# Patient Record
Sex: Female | Born: 1995 | Race: Black or African American | Hispanic: No | Marital: Single | Smoking: Former smoker
Health system: Southern US, Community
[De-identification: ages and names within clinical notes are randomized; demographics above are authoritative.]

## PROBLEM LIST (undated history)

## (undated) ENCOUNTER — Inpatient Hospital Stay (HOSPITAL_COMMUNITY): Payer: Self-pay

## (undated) DIAGNOSIS — B999 Unspecified infectious disease: Secondary | ICD-10-CM

## (undated) DIAGNOSIS — R011 Cardiac murmur, unspecified: Secondary | ICD-10-CM

## (undated) DIAGNOSIS — Z8781 Personal history of (healed) traumatic fracture: Secondary | ICD-10-CM

## (undated) DIAGNOSIS — N83209 Unspecified ovarian cyst, unspecified side: Secondary | ICD-10-CM

## (undated) DIAGNOSIS — N200 Calculus of kidney: Secondary | ICD-10-CM

## (undated) DIAGNOSIS — E282 Polycystic ovarian syndrome: Secondary | ICD-10-CM

## (undated) HISTORY — PX: CHOLECYSTECTOMY: SHX55

## (undated) HISTORY — PX: OVARIAN CYST REMOVAL: SHX89

---

## 2017-08-30 ENCOUNTER — Encounter (HOSPITAL_BASED_OUTPATIENT_CLINIC_OR_DEPARTMENT_OTHER): Payer: Self-pay

## 2017-08-30 ENCOUNTER — Emergency Department (HOSPITAL_BASED_OUTPATIENT_CLINIC_OR_DEPARTMENT_OTHER)
Admission: EM | Admit: 2017-08-30 | Discharge: 2017-08-30 | Disposition: A | Discharge status: Home / Self Care | Payer: Self-pay | Attending: Emergency Medicine | Admitting: Emergency Medicine

## 2017-08-30 ENCOUNTER — Emergency Department (HOSPITAL_BASED_OUTPATIENT_CLINIC_OR_DEPARTMENT_OTHER): Payer: Self-pay

## 2017-08-30 ENCOUNTER — Other Ambulatory Visit: Payer: Self-pay

## 2017-08-30 DIAGNOSIS — N946 Dysmenorrhea, unspecified: Secondary | ICD-10-CM | POA: Insufficient documentation

## 2017-08-30 DIAGNOSIS — R102 Pelvic and perineal pain: Secondary | ICD-10-CM | POA: Insufficient documentation

## 2017-08-30 DIAGNOSIS — Z3202 Encounter for pregnancy test, result negative: Secondary | ICD-10-CM | POA: Insufficient documentation

## 2017-08-30 HISTORY — DX: Polycystic ovarian syndrome: E28.2

## 2017-08-30 LAB — URINALYSIS, ROUTINE W REFLEX MICROSCOPIC
BILIRUBIN URINE: NEGATIVE
Bilirubin Urine: NEGATIVE
Glucose, UA: NEGATIVE mg/dL
Glucose, UA: NEGATIVE mg/dL
KETONES UR: NEGATIVE mg/dL
Ketones, ur: 15 mg/dL — AB
LEUKOCYTES UA: NEGATIVE
NITRITE: NEGATIVE
Nitrite: NEGATIVE
PH: 5.5 (ref 5.0–8.0)
PH: 6 (ref 5.0–8.0)
Protein, ur: 100 mg/dL — AB
Protein, ur: NEGATIVE mg/dL
Specific Gravity, Urine: >1.03 — ABNORMAL HIGH (ref 1.005–1.030)

## 2017-08-30 LAB — URINALYSIS, MICROSCOPIC (REFLEX): RBC / HPF: >50 RBC/hpf (ref 0–5)

## 2017-08-30 LAB — CBC
HCT: 40.8 % (ref 36.0–46.0)
Hemoglobin: 14.3 g/dL (ref 12.0–15.0)
MCH: 30 pg (ref 26.0–34.0)
MCHC: 35 g/dL (ref 30.0–36.0)
MCV: 85.7 fL (ref 78.0–100.0)
PLATELETS: 386 10*3/uL (ref 150–400)
RBC: 4.76 MIL/uL (ref 3.87–5.11)
RDW: 13.7 % (ref 11.5–15.5)
WBC: 7.2 10*3/uL (ref 4.0–10.5)

## 2017-08-30 LAB — WET PREP, GENITAL
Sperm: NONE SEEN
Trich, Wet Prep: NONE SEEN
Yeast Wet Prep HPF POC: NONE SEEN

## 2017-08-30 LAB — PREGNANCY, URINE: Preg Test, Ur: NEGATIVE

## 2017-08-30 MED ORDER — SODIUM CHLORIDE 0.9 % IV BOLUS
1000.0000 mL | Freq: Once | INTRAVENOUS | Status: AC
  Administered 2017-08-30: 1000 mL via INTRAVENOUS

## 2017-08-30 MED ORDER — MORPHINE SULFATE (PF) 4 MG/ML IV SOLN
INTRAVENOUS | Status: AC
  Filled 2017-08-30: qty 1

## 2017-08-30 MED ORDER — MORPHINE SULFATE (PF) 4 MG/ML IV SOLN
4.0000 mg | Freq: Once | INTRAVENOUS | Status: AC
  Administered 2017-08-30: 4 mg via INTRAVENOUS
  Filled 2017-08-30: qty 1

## 2017-08-30 MED ORDER — NAPROXEN 500 MG PO TABS: 500.0000 mg | ORAL_TABLET | Freq: Two times a day (BID) | ORAL | 0 refills | Status: DC

## 2017-08-30 MED ORDER — TRAMADOL HCL 50 MG PO TABS: 50.0000 mg | ORAL_TABLET | Freq: Three times a day (TID) | ORAL | 0 refills | Status: DC | PRN

## 2017-08-30 MED ORDER — MORPHINE SULFATE (PF) 4 MG/ML IV SOLN
4.0000 mg | Freq: Once | INTRAVENOUS | Status: AC
  Administered 2017-08-30: 4 mg via INTRAVENOUS

## 2017-08-30 MED ORDER — KETOROLAC TROMETHAMINE 30 MG/ML IJ SOLN
30.0000 mg | Freq: Once | INTRAMUSCULAR | Status: AC
  Administered 2017-08-30: 30 mg via INTRAVENOUS
  Filled 2017-08-30: qty 1

## 2017-08-30 NOTE — Discharge Instructions (Signed)
Take naproxen as prescribed for management of your pain.  You may take tramadol as needed for severe pain.  Do not drive or drink alcohol after taking these medications as it may make you drowsy and impair your judgment.  You may try topical heat pads on your abdomen for discomfort as well.  Follow-up with an OB/GYN for reevaluation of your symptoms.  Should you experience increased bleeding, requiring more than 1 pad per hour for 6-8 consecutive hours, if your heart rate is over 100, or if you experience persistent lightheadedness with loss of consciousness, promptly return to the ED for evaluation.

## 2017-08-30 NOTE — ED Notes (Signed)
Heating pads given to the pt.

## 2017-08-30 NOTE — ED Provider Notes (Signed)
[facility] EMERGENCY DEPARTMENT Provider Note   CSN: [id number] Arrival date & time: 08/30/17  1834     History   Chief Complaint Chief Complaint  Patient presents with  . Vaginal Bleeding    HPI Madison Stone is a 22 y.o. female.  22 year old female presents to the emergency department for lower abdominal pain and cramping.  This has been associated with vaginal bleeding over the past 5 days.  She reports the passage of clots.  Menstrual cycle is usually limited to 2 days and light bleeding.  The patient expresses concern that she may be having a miscarriage.  She has been taking NSAIDs for pain without relief.  Patient also reports some sharper pain in her suprapubic abdomen with voiding.  This is associated with a sharp, burning dysuria at her urethra.  She denies any frequency or urgency.  No fevers, bowel changes, vomiting.  She reports a history of PCOS.  Relocated to [place] 1 month ago from [place].  The history is provided by the patient. No language interpreter was used.  Vaginal Bleeding  Primary symptoms include vaginal bleeding.    Past Medical History:  Diagnosis Date  . PCOS (polycystic ovarian syndrome)     There are no active problems to display for this patient.   History reviewed. No pertinent surgical history.   OB History   None      Home Medications    Prior to Admission medications   Medication Sig Start Date End Date Taking? Authorizing Provider  naproxen (NAPROSYN) 500 MG tablet Take 1 tablet (500 mg total) by mouth 2 (two) times daily. 08/30/17   Antony Madura, PA-C  traMADol (ULTRAM) 50 MG tablet Take 1 tablet (50 mg total) by mouth every 8 (eight) hours as needed. 08/30/17   Antony Madura, PA-C    Family History No family history on file.  Social History Social History   Tobacco Use  . Smoking status: Never Smoker  . Smokeless tobacco: Never Used  Substance Use Topics  . Alcohol use: Never    Frequency: Never  .  Drug use: Yes    Types: Marijuana     Allergies   Amoxicillin and Latex   Review of Systems Review of Systems  Genitourinary: Positive for vaginal bleeding.   Ten systems reviewed and are negative for acute change, except as noted in the HPI.    Physical Exam Updated Vital Signs BP 123/87   Pulse 68   Temp (!) 97.5 F (36.4 C) (Oral)   Resp 20   Ht 5\' 1"  (1.549 m)   Wt 78.5 kg (173 lb 1.6 oz)   LMP 03/30/2017   SpO2 99%   BMI 32.71 kg/m   Physical Exam  Constitutional: She is oriented to person, place, and time. She appears well-developed and well-nourished. No distress.  Nontoxic appearing and in no distress  HENT:  Head: Normocephalic and atraumatic.  Eyes: Conjunctivae and EOM are normal. No scleral icterus.  Neck: Normal range of motion.  Cardiovascular: Normal rate, regular rhythm and intact distal pulses.  Pulmonary/Chest: Effort normal. No stridor. No respiratory distress.  Respirations even and unlabored  Abdominal: Soft. She exhibits no mass. There is tenderness. There is no rebound.  Abdominal tenderness out of proportion to exam.  This is worse in bilateral lower quadrants.  Abdomen soft, obese.  No peritoneal signs.  Genitourinary:  Genitourinary Comments: Blood in vaginal vault. Minimal clots. No cervical friability. Uterine and adnexal TTP present. No palpable adnexal masses.  Musculoskeletal: Normal range of motion.  Neurological: She is alert and oriented to person, place, and time. She exhibits normal muscle tone. Coordination normal.  GCS 15. Patient moving all extremities.  Skin: Skin is warm and dry. No rash noted. She is not diaphoretic. No erythema. No pallor.  Psychiatric: She has a normal mood and affect. Her behavior is normal.  Nursing note and vitals reviewed.    ED Treatments / Results  Labs (all labs ordered are listed, but only abnormal results are displayed) Labs Reviewed  WET PREP, GENITAL - Abnormal; Notable for the following  components:      Result Value   Clue Cells Wet Prep HPF POC PRESENT (*)    WBC, Wet Prep HPF POC FEW (*)    All other components within normal limits  URINALYSIS, ROUTINE W REFLEX MICROSCOPIC - Abnormal; Notable for the following components:   Color, Urine RED (*)    APPearance CLOUDY (*)    Specific Gravity, Urine >1.030 (*)    Hgb urine dipstick LARGE (*)    Ketones, ur 15 (*)    Protein, ur 100 (*)    Leukocytes, UA SMALL (*)    All other components within normal limits  URINALYSIS, MICROSCOPIC (REFLEX) - Abnormal; Notable for the following components:   Bacteria, UA MANY (*)    All other components within normal limits  URINALYSIS, ROUTINE W REFLEX MICROSCOPIC - Abnormal; Notable for the following components:   APPearance CLOUDY (*)    Specific Gravity, Urine >1.030 (*)    Hgb urine dipstick LARGE (*)    All other components within normal limits  URINALYSIS, MICROSCOPIC (REFLEX) - Abnormal; Notable for the following components:   Bacteria, UA RARE (*)    All other components within normal limits  PREGNANCY, URINE  CBC  GC/CHLAMYDIA PROBE AMP ([place]) NOT AT [facility]    EKG None  Radiology US Pelvic Complete W Transvaginal And Torsion R/o  Result Date: 08/30/2017 CLINICAL DATA:  Heavy vaginal bleeding for 5 days EXAM: TRANSABDOMINAL AND TRANSVAGINAL ULTRASOUND OF PELVIS DOPPLER ULTRASOUND OF OVARIES TECHNIQUE: Both transabdominal and transvaginal ultrasound examinations of the pelvis were performed. Transabdominal technique was performed for global imaging of the pelvis including uterus, ovaries, adnexal regions, and pelvic cul-de-sac. It was necessary to proceed with endovaginal exam following the transabdominal exam to visualize the uterus endometrium and ovaries. Color and duplex Doppler ultrasound was utilized to evaluate blood flow to the ovaries. COMPARISON:  None. FINDINGS: Uterus Measurements: 9 x 3.8 x 5.5 cm. No fibroids or other mass visualized. Endometrium  Thickness: 2 mm.  No focal abnormality visualized. Right ovary Measurements: 3.1 x 1.8 x 2 cm.  Normal appearance/no adnexal mass. Left ovary Measurements: 2.5 x 2 x 2.2 cm.  Normal appearance/no adnexal mass. Pulsed Doppler evaluation of both ovaries demonstrates normal low-resistance arterial and venous waveforms. Other findings No abnormal free fluid. IMPRESSION: 1. Endometrial thickness of 2 mm. If bleeding remains unresponsive to hormonal or medical therapy, sonohysterogram should be considered for focal lesion work-up. (Ref: Radiological Reasoning: Algorithmic Workup of Abnormal Vaginal Bleeding with Endovaginal Sonography and Sonohysterography. AJR 2008; 161:W96-04) 2. Negative for ovarian torsion Electronically Signed   By: Jasmine Pang M.D.   On: 08/30/2017 22:11    Procedures Procedures (including critical care time)  Medications Ordered in ED Medications  sodium chloride 0.9 % bolus 1,000 mL (0 mLs Intravenous Stopped 08/30/17 2041)  ketorolac (TORADOL) 30 MG/ML injection 30 mg (30 mg Intravenous Given 08/30/17 2010)  morphine  4 MG/ML injection 4 mg (4 mg Intravenous Given 08/30/17 2116)  morphine 4 MG/ML injection 4 mg (4 mg Intravenous Given 08/30/17 2210)    11:00 PM Ultrasound findings discussed with [facility]old [facility].  Dr. Debroah LoopArnold agrees that symptoms are consistent with dysmenorrhea without complications.  Does not recommend any additional management or medications.  Encourage his OB/GYN follow-up.   Initial Impression / Assessment and Plan / ED Course  I have reviewed the triage vital signs and the nursing notes.  Pertinent labs & imaging results that were available during my care of the patient were reviewed by me and considered in my medical decision making (see chart for details).     22 year old female presents for lower abdominal cramping in the setting of vaginal bleeding.  Symptoms consistent with dysmenorrhea.  She has a normal vaginal ultrasound today  without evidence of torsion.  Vital signs have been stable.  No concern for emergent blood loss given absence of anemia, tachycardia, hypotension.  Initial urinalysis consistent with contamination.  Repeat urinalysis improved.  Pregnancy is negative.  No leukocytosis or fever to suggest infectious cause of pain.  Symptoms managed in the ED with NSAIDs as well as morphine x2.  Have encouraged outpatient OB/GYN follow-up as well as continued use of naproxen.  Will provide short course of tramadol.  Return precautions discussed and provided. Patient discharged in stable condition with no unaddressed concerns.   Final Clinical Impressions(s) / ED Diagnoses   Final diagnoses:  Pelvic pain in female  Dysmenorrhea    ED Discharge Orders        Ordered    naproxen (NAPROSYN) 500 MG tablet  2 times daily     08/30/17 2311    traMADol (ULTRAM) 50 MG tablet  Every 8 hours PRN     08/30/17 2311       Antony MaduraHumes, Joycelynn Fritsche, PA-C 08/30/17 2326    Linwood DibblesKnapp, Jon, MD 08/31/17 [phone]

## 2017-08-30 NOTE — ED Notes (Signed)
Pt understood dc material. NAD Noted. Script given at dc 

## 2017-08-30 NOTE — ED Notes (Signed)
Patient transported to Ultrasound 

## 2017-08-30 NOTE — ED Triage Notes (Signed)
Pt reports vaginal bleeding with clots x 5 days with abdominal cramping. Pt concerned she is having a miscarriage.

## 2017-09-02 LAB — GC/CHLAMYDIA PROBE AMP (~~LOC~~) NOT AT ARMC
CHLAMYDIA, DNA PROBE: NEGATIVE
Neisseria Gonorrhea: NEGATIVE

## 2018-03-19 NOTE — L&D Delivery Note (Signed)
Delivery Note At 9:31 PM a viable female was delivered via  (Presentation:LOA).  APGAR: , ; weight pending.   Placenta status: spontaneous and intact Cord: 3 vessel cord, Cord pH: not taken  Anesthesia: Epidural Episiotomy:  n/a Lacerations:  Nil  Suture Repair: n/a Est. Blood Loss (mL): 300 mL  Mom to Telemetry Unit.  Baby to dad.  Poonam Patel 12/07/2018, 10:00 PM  OB FELLOW DELIVERY ATTESTATION  I was gloved and present for the delivery in its entirety, and I agree with the above resident's note. Direct OP then restituted to LOA. Please see separate note with respect to mother during and after delivery. During labor course patient noted to be hypotensive and hypothermic for which sepsis protocol initiated and patient started on abx. Mother with frequent episodes where she seemingly was unconscious although vitals stable and patient regained consciousness after < 15 seconds. Evaluated by Neurology and given Ativan. CT Head ordered. Leading differential psychogenic at this time. CT Abd/Pelv also ordered for persistent abdominal pain along with aforementioned course.  APGAR (1 MIN): 8   APGAR (5 MINS): 9   Weight: 2685 g   Barrington Ellison, MD OB Family Medicine Fellow, [facility] for [facility], [place]

## 2018-04-15 ENCOUNTER — Emergency Department (HOSPITAL_BASED_OUTPATIENT_CLINIC_OR_DEPARTMENT_OTHER)
Admission: EM | Admit: 2018-04-15 | Discharge: 2018-04-15 | Disposition: A | Discharge status: Home / Self Care | Payer: Medicaid Other | Attending: Emergency Medicine | Admitting: Emergency Medicine

## 2018-04-15 ENCOUNTER — Encounter (HOSPITAL_BASED_OUTPATIENT_CLINIC_OR_DEPARTMENT_OTHER): Payer: Self-pay | Admitting: *Deleted

## 2018-04-15 ENCOUNTER — Other Ambulatory Visit: Payer: Self-pay

## 2018-04-15 ENCOUNTER — Emergency Department (HOSPITAL_BASED_OUTPATIENT_CLINIC_OR_DEPARTMENT_OTHER): Payer: Medicaid Other

## 2018-04-15 DIAGNOSIS — R109 Unspecified abdominal pain: Secondary | ICD-10-CM

## 2018-04-15 DIAGNOSIS — F1210 Cannabis abuse, uncomplicated: Secondary | ICD-10-CM | POA: Diagnosis not present

## 2018-04-15 DIAGNOSIS — Z8759 Personal history of other complications of pregnancy, childbirth and the puerperium: Secondary | ICD-10-CM | POA: Insufficient documentation

## 2018-04-15 DIAGNOSIS — O26899 Other specified pregnancy related conditions, unspecified trimester: Secondary | ICD-10-CM | POA: Diagnosis present

## 2018-04-15 DIAGNOSIS — O208 Other hemorrhage in early pregnancy: Secondary | ICD-10-CM | POA: Diagnosis not present

## 2018-04-15 DIAGNOSIS — Z3A00 Weeks of gestation of pregnancy not specified: Secondary | ICD-10-CM | POA: Insufficient documentation

## 2018-04-15 DIAGNOSIS — R1030 Lower abdominal pain, unspecified: Secondary | ICD-10-CM | POA: Insufficient documentation

## 2018-04-15 DIAGNOSIS — R42 Dizziness and giddiness: Secondary | ICD-10-CM | POA: Diagnosis not present

## 2018-04-15 DIAGNOSIS — O209 Hemorrhage in early pregnancy, unspecified: Secondary | ICD-10-CM

## 2018-04-15 DIAGNOSIS — Z91040 Latex allergy status: Secondary | ICD-10-CM | POA: Insufficient documentation

## 2018-04-15 LAB — WET PREP, GENITAL
Sperm: NONE SEEN
Trich, Wet Prep: NONE SEEN
Yeast Wet Prep HPF POC: NONE SEEN

## 2018-04-15 LAB — CBC WITH DIFFERENTIAL/PLATELET
Abs Immature Granulocytes: 0.02 10*3/uL (ref 0.00–0.07)
Basophils Absolute: 0 10*3/uL (ref 0.0–0.1)
Basophils Relative: 0 %
Eosinophils Absolute: 0.1 10*3/uL (ref 0.0–0.5)
Eosinophils Relative: 1 %
HCT: 46.3 % — ABNORMAL HIGH (ref 36.0–46.0)
Hemoglobin: 15.2 g/dL — ABNORMAL HIGH (ref 12.0–15.0)
Immature Granulocytes: 0 %
Lymphocytes Relative: 57 %
Lymphs Abs: 4.9 10*3/uL — ABNORMAL HIGH (ref 0.7–4.0)
MCH: 29.7 pg (ref 26.0–34.0)
MCHC: 32.8 g/dL (ref 30.0–36.0)
MCV: 90.6 fL (ref 80.0–100.0)
Monocytes Absolute: 0.5 10*3/uL (ref 0.1–1.0)
Monocytes Relative: 6 %
Neutro Abs: 3.1 10*3/uL (ref 1.7–7.7)
Neutrophils Relative %: 36 %
Platelets: 411 10*3/uL — ABNORMAL HIGH (ref 150–400)
RBC: 5.11 MIL/uL (ref 3.87–5.11)
RDW: 14 % (ref 11.5–15.5)
WBC: 8.7 10*3/uL (ref 4.0–10.5)
nRBC: 0 % (ref 0.0–0.2)

## 2018-04-15 LAB — COMPREHENSIVE METABOLIC PANEL
ALT: 21 U/L (ref 0–44)
AST: 21 U/L (ref 15–41)
Albumin: 4.4 g/dL (ref 3.5–5.0)
Alkaline Phosphatase: 75 U/L (ref 38–126)
Anion gap: 8 (ref 5–15)
BUN: 7 mg/dL (ref 6–20)
CO2: 21 mmol/L — ABNORMAL LOW (ref 22–32)
Calcium: 9.2 mg/dL (ref 8.9–10.3)
Chloride: 106 mmol/L (ref 98–111)
Creatinine, Ser: 0.53 mg/dL (ref 0.44–1.00)
GFR calc Af Amer: >60 mL/min (ref >60)
GFR calc non Af Amer: >60 mL/min (ref >60)
Glucose, Bld: 83 mg/dL (ref 70–99)
Potassium: 4 mmol/L (ref 3.5–5.1)
Sodium: 135 mmol/L (ref 135–145)
Total Bilirubin: 0.7 mg/dL (ref 0.3–1.2)
Total Protein: 7.8 g/dL (ref 6.5–8.1)

## 2018-04-15 LAB — HCG, QUANTITATIVE, PREGNANCY: hCG, Beta Chain, Quant, S: 100 m[IU]/mL — ABNORMAL HIGH (ref <5)

## 2018-04-15 LAB — URINALYSIS, ROUTINE W REFLEX MICROSCOPIC
Bilirubin Urine: NEGATIVE
Glucose, UA: NEGATIVE mg/dL
Hgb urine dipstick: NEGATIVE
Ketones, ur: NEGATIVE mg/dL
Leukocytes, UA: NEGATIVE
Nitrite: NEGATIVE
Protein, ur: NEGATIVE mg/dL
Specific Gravity, Urine: 1.015 (ref 1.005–1.030)
pH: 8 (ref 5.0–8.0)

## 2018-04-15 LAB — PREGNANCY, URINE: Preg Test, Ur: POSITIVE — AB

## 2018-04-15 MED ORDER — ACETAMINOPHEN 500 MG PO TABS
1000.0000 mg | ORAL_TABLET | Freq: Once | ORAL | Status: AC
  Administered 2018-04-15: 1000 mg via ORAL
  Filled 2018-04-15: qty 2

## 2018-04-15 MED ORDER — SODIUM CHLORIDE 0.9 % IV BOLUS
1000.0000 mL | Freq: Once | INTRAVENOUS | Status: AC
  Administered 2018-04-15: 1000 mL via INTRAVENOUS

## 2018-04-15 NOTE — Discharge Instructions (Addendum)
Your pregnancy hormone is elevated today however a intra-uterine pregnancy was not confirmed.  It is too early to tell.  You are advised to follow-up with your OB/GYN or the women's outpatient clinic for repeat hCG hormone levels in 48 hours.  A repeat ultrasound was recommended in 10 to 14 days.  Begin taking prenatal vitamins.  Take only Tylenol for pain.  Avoid ibuprofen or Aleve or other NSAID medication.  Please go to [facility] or [facility] if you develop increasing bleeding, pain, passing out, or any other new or concerning symptoms.

## 2018-04-15 NOTE — ED Provider Notes (Signed)
[facility] EMERGENCY DEPARTMENT Provider Note   CSN: [id number] Arrival date & time: 04/15/18  1216     History   Chief Complaint Chief Complaint  Patient presents with  . Abdominal Pain    HPI Madison Stone is a 23 y.o. G63P3 female with history of PCOS who presents with lower abdominal pain and vaginal bleeding.  She had 6+ home pregnancy test.  She reports she has had 2 days of lower abdominal cramping and back pain.  She has been having 2 days of vaginal bleeding, but had 3 days of vaginal bleeding last week outside of her normal menstrual cycle.  She denies any other abdominal pain, nausea, vomiting, urinary symptoms.  She has history of 1 miscarriage prior to her 3 living children.  She is currently breast-feeding her 37-year-old.  She denies any chest pain, shortness of breath.  She has had some intermittent lightheadedness past couple days.  She denies any syncope.  She reports she was taking ibuprofen last week due to thinking her period was early and she had menstrual cramps. LMP 04/12/2017. HPI  Past Medical History:  Diagnosis Date  . PCOS (polycystic ovarian syndrome)     There are no active problems to display for this patient.   Past Surgical History:  Procedure Laterality Date  . CHOLECYSTECTOMY    . OVARIAN CYST REMOVAL     3 separate surgeries      OB History    Gravida  5   Para  3   Term  2   Preterm  1   AB  1   Living  3     SAB  1   TAB  0   Ectopic  0   Multiple  0   Live Births  3            Home Medications    Prior to Admission medications   Medication Sig Start Date End Date Taking? Authorizing Provider  naproxen (NAPROSYN) 500 MG tablet Take 1 tablet (500 mg total) by mouth 2 (two) times daily. 08/30/17   Antony Madura, PA-C  traMADol (ULTRAM) 50 MG tablet Take 1 tablet (50 mg total) by mouth every 8 (eight) hours as needed. 08/30/17   Antony Madura, PA-C    Family History No family history on  file.  Social History Social History   Tobacco Use  . Smoking status: Never Smoker  . Smokeless tobacco: Never Used  Substance Use Topics  . Alcohol use: Yes    Frequency: Never  . Drug use: Yes    Types: Marijuana     Allergies   Amoxicillin and Latex   Review of Systems Review of Systems  Constitutional: Negative for chills and fever.  HENT: Negative for facial swelling and sore throat.   Respiratory: Negative for shortness of breath.   Cardiovascular: Negative for chest pain.  Gastrointestinal: Positive for abdominal pain. Negative for nausea and vomiting.  Genitourinary: Positive for vaginal bleeding. Negative for dysuria.  Musculoskeletal: Negative for back pain.  Skin: Negative for rash and wound.  Neurological: Positive for light-headedness. Negative for syncope and headaches.  Psychiatric/Behavioral: The patient is not nervous/anxious.      Physical Exam Updated Vital Signs BP (!) 145/92 (BP Location: Left Arm)   Pulse 64   Temp 97.9 F (36.6 C) (Oral)   Resp 16   Ht 5\' 1"  (1.549 m)   Wt 69.9 kg   LMP 03/09/2018 (Exact Date)   SpO2 100%  BMI 29.10 kg/m   Physical Exam Vitals signs and nursing note reviewed.  Constitutional:      General: She is not in acute distress.    Appearance: She is well-developed. She is not diaphoretic.  HENT:     Head: Normocephalic and atraumatic.     Mouth/Throat:     Pharynx: No oropharyngeal exudate.  Eyes:     General: No scleral icterus.       Right eye: No discharge.        Left eye: No discharge.     Conjunctiva/sclera: Conjunctivae normal.     Pupils: Pupils are equal, round, and reactive to light.  Neck:     Musculoskeletal: Normal range of motion and neck supple.     Thyroid: No thyromegaly.  Cardiovascular:     Rate and Rhythm: Normal rate and regular rhythm.     Heart sounds: Normal heart sounds. No murmur. No friction rub. No gallop.   Pulmonary:     Effort: Pulmonary effort is normal. No  respiratory distress.     Breath sounds: Normal breath sounds. No stridor. No wheezing or rales.  Abdominal:     General: Bowel sounds are normal. There is no distension.     Palpations: Abdomen is soft.     Tenderness: There is abdominal tenderness in the right lower quadrant, suprapubic area and left lower quadrant. There is no right CVA tenderness, left CVA tenderness, guarding or rebound.  Genitourinary:    Vagina: Vaginal discharge (white) present. No bleeding.     Cervix: No cervical motion tenderness.     Adnexa:        Right: Tenderness present.        Left: Tenderness present.   Lymphadenopathy:     Cervical: No cervical adenopathy.  Skin:    General: Skin is warm and dry.     Coloration: Skin is not pale.     Findings: No rash.  Neurological:     Mental Status: She is alert.     Coordination: Coordination normal.      ED Treatments / Results  Labs (all labs ordered are listed, but only abnormal results are displayed) Labs Reviewed  WET PREP, GENITAL - Abnormal; Notable for the following components:      Result Value   Clue Cells Wet Prep HPF POC PRESENT (*)    WBC, Wet Prep HPF POC FEW (*)    All other components within normal limits  PREGNANCY, URINE - Abnormal; Notable for the following components:   Preg Test, Ur POSITIVE (*)    All other components within normal limits  COMPREHENSIVE METABOLIC PANEL - Abnormal; Notable for the following components:   CO2 21 (*)    All other components within normal limits  CBC WITH DIFFERENTIAL/PLATELET - Abnormal; Notable for the following components:   Hemoglobin 15.2 (*)    HCT 46.3 (*)    Platelets 411 (*)    Lymphs Abs 4.9 (*)    All other components within normal limits  HCG, QUANTITATIVE, PREGNANCY - Abnormal; Notable for the following components:   hCG, Beta Chain, Quant, S 100 (*)    All other components within normal limits  URINE CULTURE  URINALYSIS, ROUTINE W REFLEX MICROSCOPIC  GC/CHLAMYDIA PROBE AMP ([facility]) NOT AT [facility]    EKG None  Radiology US Ob Comp < 14 Wks  Result Date: 04/15/2018 CLINICAL DATA:  Lower abdominal pain for 4 days. Vaginal bleeding last week with spotting today. Patient has  a positive urinary pregnancy test. She is 5 weeks and 2 days pregnant based on her last menstrual period. EXAM: OBSTETRIC <14 WK [place] AND TRANSVAGINAL OB US TECHNIQUE: Both transabdominal and transvaginal ultrasound examinations were performed for complete evaluation of the gestation as well as the maternal uterus, adnexal regions, and pelvic cul-de-sac. Transvaginal technique was performed to assess early pregnancy. COMPARISON:  08/30/2017 FINDINGS: Intrauterine gestational sac: None Yolk sac:  Not Visualized. Embryo:  Not Visualized. Maternal uterus/adnexae: Endometrium measures a maximum 15 mm in thickness in thickness. No endometrial mass or fluid. No uterine masses. Complex cyst arises from the right ovary measuring 2.6 x 2.0 x 2.6 cm there are a few thin septations. No internal blood flow. Complex cyst arises from the left ovary measuring 3.4 x 3.0 x 3.4 cm. This has the appearance of a hemorrhagic cyst. No adnexal masses.  No abnormal pelvic free fluid. IMPRESSION: 1. No visualized intrauterine pregnancy or ectopic pregnancy. 2. Endometrium is thickened to 15 mm. This suggests an early intrauterine pregnancy. Recommend correlation with the beta HCG level, and follow-up serial beta HCG levels and repeat sonographic imaging in 10-14 days to document normal pregnancy progression. 3. Bilateral ovarian cysts, the right likely a dominant physiologic cyst with a few septations, the larger on the left, measuring 3.4 cm, may reflect a hemorrhagic cyst. Electronically Signed   By: Amie Portland M.D.   On: 04/15/2018 15:54   US Ob Transvaginal  Result Date: 04/15/2018 CLINICAL DATA:  Lower abdominal pain for 4 days. Vaginal bleeding last week with spotting today. Patient has a positive urinary pregnancy test. She is  5 weeks and 2 days pregnant based on her last menstrual period. EXAM: OBSTETRIC <14 WK [place] AND TRANSVAGINAL OB US TECHNIQUE: Both transabdominal and transvaginal ultrasound examinations were performed for complete evaluation of the gestation as well as the maternal uterus, adnexal regions, and pelvic cul-de-sac. Transvaginal technique was performed to assess early pregnancy. COMPARISON:  08/30/2017 FINDINGS: Intrauterine gestational sac: None Yolk sac:  Not Visualized. Embryo:  Not Visualized. Maternal uterus/adnexae: Endometrium measures a maximum 15 mm in thickness in thickness. No endometrial mass or fluid. No uterine masses. Complex cyst arises from the right ovary measuring 2.6 x 2.0 x 2.6 cm there are a few thin septations. No internal blood flow. Complex cyst arises from the left ovary measuring 3.4 x 3.0 x 3.4 cm. This has the appearance of a hemorrhagic cyst. No adnexal masses.  No abnormal pelvic free fluid. IMPRESSION: 1. No visualized intrauterine pregnancy or ectopic pregnancy. 2. Endometrium is thickened to 15 mm. This suggests an early intrauterine pregnancy. Recommend correlation with the beta HCG level, and follow-up serial beta HCG levels and repeat sonographic imaging in 10-14 days to document normal pregnancy progression. 3. Bilateral ovarian cysts, the right likely a dominant physiologic cyst with a few septations, the larger on the left, measuring 3.4 cm, may reflect a hemorrhagic cyst. Electronically Signed   By: Amie Portland M.D.   On: 04/15/2018 15:54    Procedures Procedures (including critical care time)  Medications Ordered in ED Medications  sodium chloride 0.9 % bolus 1,000 mL (0 mLs Intravenous Stopped 04/15/18 1739)  acetaminophen (TYLENOL) tablet 1,000 mg (1,000 mg Oral Given 04/15/18 1704)     Initial Impression / Assessment and Plan / ED Course  I have reviewed the triage vital signs and the nursing notes.  Pertinent labs & imaging results that were available during  my care of the patient were reviewed by me  and considered in my medical decision making (see chart for details).     Patient presenting with vaginal bleeding and lower abdominal cramping.  Urine pregnancy is positive.  hCG 100.  Hemoglobin mildly hemoconcentrated otherwise labs unremarkable.  Urine is negative.  Pelvic ultrasound shows no visualized intrauterine pregnancy or ectopic pregnancy, however endometrium is thickened to 15 mm which suggest an early intrauterine pregnancy.  Serial hCG and repeat ultrasound suggested.  Ultrasound also showed bilateral ovarian cysts.  Wet prep shows clue cells, however will defer treatment to OB/GYN at this time. Blood pressure mildly elevated, patient was in pain during visit. Will have Ob/Gyn recheck in 2 days.  Patient advised to begin prenatal vitamins.  She is advised to stop taking ibuprofen and other NSAIDs and take only Tylenol for pain.  Patient given follow-up in 2 days for repeat hCG.  Return precautions discussed.  Patient understands and agrees with plan.  Patient vital stable throughout ED course and discharged in satisfactory condition.  Final Clinical Impressions(s) / ED Diagnoses   Final diagnoses:  Abdominal pain affecting pregnancy  Vaginal bleeding affecting early pregnancy    ED Discharge Orders    None       Emi HolesLaw, Delisia Mcquiston M, PA-C 04/15/18 1752    Raeford RazorKohut, Stephen, MD 04/15/18 2307

## 2018-04-15 NOTE — ED Notes (Signed)
Patient transported to Ultrasound 

## 2018-04-15 NOTE — ED Notes (Signed)
C/o lower abd pain  [place] x 4 days  Some vomiting at times   Has had 6 pos home preg test

## 2018-04-15 NOTE — ED Notes (Signed)
NAD at this time. Pt is stable and going home.  

## 2018-04-15 NOTE — ED Triage Notes (Signed)
States she took 6 pregnancy test and they were all positive. Lower abdominal pain. Last week she had some bleeding. Spotting today.

## 2018-04-16 LAB — URINE CULTURE: Culture: NO GROWTH

## 2018-04-16 LAB — GC/CHLAMYDIA PROBE AMP (~~LOC~~) NOT AT ARMC
Chlamydia: NEGATIVE
Neisseria Gonorrhea: NEGATIVE

## 2018-04-17 ENCOUNTER — Ambulatory Visit (INDEPENDENT_AMBULATORY_CARE_PROVIDER_SITE_OTHER): Payer: Self-pay | Admitting: *Deleted

## 2018-04-17 DIAGNOSIS — O3680X0 Pregnancy with inconclusive fetal viability, not applicable or unspecified: Secondary | ICD-10-CM

## 2018-04-17 LAB — HCG, QUANTITATIVE, PREGNANCY: hCG, Beta Chain, Quant, S: 249 m[IU]/mL — ABNORMAL HIGH (ref <5)

## 2018-04-17 NOTE — Progress Notes (Signed)
Here for stat bhcg. Denies pain.Denies bleeding. Explained we will draw stat bhcg and have her wait in lobby for results which we will then discuss with provider and then her. She voices understanding.  Linda,RN Reviewed results with Dr. Vergie LivingPickens and informed patient bhcg appropriate rise but since we cannot confirm there is a live baby yet recommend US in 2 weeks. She agrees with plan of care. US scheduled. Ectopic precautions given.  Linda,RN

## 2018-04-21 NOTE — Progress Notes (Signed)
I have reviewed the chart and agree with nursing staff's documentation of this patient's encounter.  [place], MD 04/21/2018 1:29 PM

## 2018-04-30 ENCOUNTER — Other Ambulatory Visit: Payer: Self-pay

## 2018-04-30 ENCOUNTER — Telehealth: Payer: Self-pay | Admitting: General Practice

## 2018-04-30 ENCOUNTER — Inpatient Hospital Stay (HOSPITAL_COMMUNITY)
Admission: AD | Admit: 2018-04-30 | Discharge: 2018-04-30 | Disposition: A | Discharge status: Home / Self Care | Payer: Medicaid Other | Attending: Obstetrics & Gynecology | Admitting: Obstetrics & Gynecology

## 2018-04-30 ENCOUNTER — Encounter (HOSPITAL_COMMUNITY): Payer: Self-pay | Admitting: *Deleted

## 2018-04-30 DIAGNOSIS — Z3A01 Less than 8 weeks gestation of pregnancy: Secondary | ICD-10-CM

## 2018-04-30 DIAGNOSIS — O210 Mild hyperemesis gravidarum: Secondary | ICD-10-CM | POA: Insufficient documentation

## 2018-04-30 DIAGNOSIS — Z880 Allergy status to penicillin: Secondary | ICD-10-CM | POA: Insufficient documentation

## 2018-04-30 DIAGNOSIS — O219 Vomiting of pregnancy, unspecified: Secondary | ICD-10-CM

## 2018-04-30 MED ORDER — PANTOPRAZOLE SODIUM 40 MG IV SOLR
40.0000 mg | Freq: Once | INTRAVENOUS | Status: AC
  Administered 2018-04-30: 40 mg via INTRAVENOUS
  Filled 2018-04-30: qty 40

## 2018-04-30 MED ORDER — METOCLOPRAMIDE HCL 5 MG/ML IJ SOLN
5.0000 mg | Freq: Once | INTRAMUSCULAR | Status: AC
  Administered 2018-04-30: 5 mg via INTRAVENOUS
  Filled 2018-04-30: qty 2

## 2018-04-30 MED ORDER — THIAMINE HCL 100 MG/ML IJ SOLN
Freq: Once | INTRAVENOUS | Status: AC
  Administered 2018-04-30: 13:00:00 via INTRAVENOUS
  Filled 2018-04-30: qty 1000

## 2018-04-30 MED ORDER — LACTATED RINGERS IV BOLUS
1000.0000 mL | Freq: Once | INTRAVENOUS | Status: AC
  Administered 2018-04-30: 1000 mL via INTRAVENOUS

## 2018-04-30 MED ORDER — METOCLOPRAMIDE HCL 10 MG PO TABS: 10.0000 mg | ORAL_TABLET | Freq: Four times a day (QID) | ORAL | 0 refills | Status: DC

## 2018-04-30 NOTE — Discharge Instructions (Signed)
Morning Sickness ° °Morning sickness is when a woman feels nauseous during pregnancy. This nauseous feeling may or may not come with vomiting. It often occurs in the morning, but it can be a problem at any time of day. Morning sickness is most common during the first trimester. In some cases, it may continue throughout pregnancy. Although morning sickness is unpleasant, it is usually harmless unless the woman develops severe and continual vomiting (hyperemesis gravidarum), a condition that requires more intense treatment. °What are the causes? °The exact cause of this condition is not known, but it seems to be related to normal hormonal changes that occur in pregnancy. °What increases the risk? °You are more likely to develop this condition if: °· You experienced nausea or vomiting before your pregnancy. °· You had morning sickness during a previous pregnancy. °· You are pregnant with more than one baby, such as twins. °What are the signs or symptoms? °Symptoms of this condition include: °· Nausea. °· Vomiting. °How is this diagnosed? °This condition is usually diagnosed based on your signs and symptoms. °How is this treated? °In many cases, treatment is not needed for this condition. Making some changes to what you eat may help to control symptoms. Your health care provider may also prescribe or recommend: °· Vitamin B6 supplements. °· Anti-nausea medicines. °· Ginger. °Follow these instructions at home: °Medicines °· Take over-the-counter and prescription medicines only as told by your health care provider. Do not use any prescription, over-the-counter, or herbal medicines for morning sickness without first talking with your health care provider. °· Taking multivitamins before getting pregnant can prevent or decrease the severity of morning sickness in most women. °Eating and drinking °· Eat a piece of dry toast or crackers before getting out of bed in the morning. °· Eat 5 or 6 small meals a day. °· Eat dry and  bland foods, such as rice or a baked potato. Foods that are high in carbohydrates are often helpful. °· Avoid greasy, fatty, and spicy foods. °· Have someone cook for you if the smell of any food causes nausea and vomiting. °· If you feel nauseous after taking prenatal vitamins, take the vitamins at night or with a snack. °· Snack on protein foods between meals if you are hungry. Nuts, yogurt, and cheese are good options. °· Drink fluids throughout the day. °· Try ginger ale made with real ginger, ginger tea made from fresh grated ginger, or ginger candies. °General instructions °· Do not use any products that contain nicotine or tobacco, such as cigarettes and e-cigarettes. If you need help quitting, ask your health care provider. °· Get an air purifier to keep the air in your house free of odors. °· Get plenty of fresh air. °· Try to avoid odors that trigger your nausea. °· Consider trying these methods to help relieve symptoms: °? Wearing an acupressure wristband. These wristbands are often worn for seasickness. °? Acupuncture. °Contact a health care provider if: °· Your home remedies are not working and you need medicine. °· You feel dizzy or light-headed. °· You are losing weight. °Get help right away if: °· You have persistent and uncontrolled nausea and vomiting. °· You faint. °· You have severe pain in your abdomen. °Summary °· Morning sickness is when a woman feels nauseous during pregnancy. This nauseous feeling may or may not come with vomiting. °· Morning sickness is most common during the first trimester. °· It often occurs in the morning, but it can be a problem at   any time of day. °· In many cases, treatment is not needed for this condition. Making some changes to what you eat may help to control symptoms. °This information is not intended to replace advice given to you by your health care provider. Make sure you discuss any questions you have with your health care provider. °Document Released:  04/26/2006 Document Revised: 04/07/2016 Document Reviewed: 04/07/2016 °Elsevier Interactive Patient Education © 2019 Elsevier Inc. °Bland Diet °A bland diet consists of foods that are often soft and do not have a lot of fat, fiber, or extra seasonings. Foods without fat, fiber, or seasoning are easier for the body to digest. They are also less likely to irritate your mouth, throat, stomach, and other parts of your digestive system. A bland diet is sometimes called a BRAT diet. °What is my plan? °Your health care provider or food and nutrition specialist (dietitian) may recommend specific changes to your diet to prevent symptoms or to treat your symptoms. These changes may include: °· Eating small meals often. °· Cooking food until it is soft enough to chew easily. °· Chewing your food well. °· Drinking fluids slowly. °· Not eating foods that are very spicy, sour, or fatty. °· Not eating citrus fruits, such as oranges and grapefruit. °What do I need to know about this diet? °· Eat a variety of foods from the bland diet food list. °· Do not follow a bland diet longer than needed. °· Ask your health care provider whether you should take vitamins or supplements. °What foods can I eat? °Grains ° °Hot cereals, such as cream of wheat. Rice. Bread, crackers, or tortillas made from refined white flour. °Vegetables °Canned or cooked vegetables. Mashed or boiled potatoes. °Fruits ° °Bananas. Applesauce. Other types of cooked or canned fruit with the skin and seeds removed, such as canned peaches or pears. °Meats and other proteins ° °Scrambled eggs. Creamy peanut butter or other nut butters. Lean, well-cooked meats, such as chicken or fish. Tofu. Soups or broths. °Dairy °Low-fat dairy products, such as milk, cottage cheese, or yogurt. °Beverages ° °Water. Herbal tea. Apple juice. °Fats and oils °Mild salad dressings. Canola or olive oil. °Sweets and desserts °Pudding. Custard. Fruit gelatin. Ice cream. °The items listed above  may not be a complete list of recommended foods and beverages. Contact a dietitian for more options. °What foods are not recommended? °Grains °Whole grain breads and cereals. °Vegetables °Raw vegetables. °Fruits °Raw fruits, especially citrus, berries, or dried fruits. °Dairy °Whole fat dairy foods. °Beverages °Caffeinated drinks. Alcohol. °Seasonings and condiments °Strongly flavored seasonings or condiments. Hot sauce. Salsa. °Other foods °Spicy foods. Fried foods. Sour foods, such as pickled or fermented foods. Foods with high sugar content. Foods high in fiber. °The items listed above may not be a complete list of foods and beverages to avoid. Contact a dietitian for more information. °Summary °· A bland diet consists of foods that are often soft and do not have a lot of fat, fiber, or extra seasonings. °· Foods without fat, fiber, or seasoning are easier for the body to digest. °· Check with your health care provider to see how long you should follow this diet plan. It is not meant to be followed for long periods. °This information is not intended to replace advice given to you by your health care provider. Make sure you discuss any questions you have with your health care provider. °Document Released: 06/27/2015 Document Revised: 04/03/2017 Document Reviewed: 04/03/2017 °Elsevier Interactive Patient Education © 2019 Elsevier   Inc. ° °

## 2018-04-30 NOTE — MAU Provider Note (Signed)
History     CSN: [id number]  Arrival date and time: 04/30/18 1135   First Provider Initiated Contact with Patient 04/30/18 1248      Chief Complaint  Patient presents with  . Morning Sickness   Madison Stone is a 23 y.o. [phone] at 6318w3d who presents for Morning Sickness.  She states she has not been able to eat or drink for the last 3 days.  She states that she is now throwing up bile and tried to take zofran without success.  She states she has not been able to urinate, but has had diarrhea.  Patient also reports some abdominal and pelvic pain, but contributes this to the excessive vomiting.  She denies vaginal bleeding.    OB History    Gravida  5   Para  3   Term  2   Preterm  1   AB  1   Living  3     SAB  1   TAB  0   Ectopic  0   Multiple  0   Live Births  3           Past Medical History:  Diagnosis Date  . PCOS (polycystic ovarian syndrome)     Past Surgical History:  Procedure Laterality Date  . CHOLECYSTECTOMY    . OVARIAN CYST REMOVAL     3 separate surgeries     History reviewed. No pertinent family history.  Social History   Tobacco Use  . Smoking status: Never Smoker  . Smokeless tobacco: Never Used  Substance Use Topics  . Alcohol use: Yes    Frequency: Never  . Drug use: Yes    Types: Marijuana    Allergies:  Allergies  Allergen Reactions  . Amoxicillin   . Latex     Medications Prior to Admission  Medication Sig Dispense Refill Last Dose  . naproxen (NAPROSYN) 500 MG tablet Take 1 tablet (500 mg total) by mouth 2 (two) times daily. 30 tablet 0   . traMADol (ULTRAM) 50 MG tablet Take 1 tablet (50 mg total) by mouth every 8 (eight) hours as needed. 8 tablet 0     Review of Systems  Constitutional: Negative for chills and fatigue.  Gastrointestinal: Positive for abdominal pain, diarrhea, nausea and vomiting.  Genitourinary: Positive for pelvic pain.  Neurological: Positive for dizziness, light-headedness and  headaches.   Physical Exam   Blood pressure (!) 122/57, pulse (!) 56, temperature 98.6 F (37 C), resp. rate 16, last menstrual period 03/09/2018, SpO2 100 %.  Physical Exam  Constitutional: She is oriented to person, place, and time. She appears well-developed and well-nourished. She appears distressed.  HENT:  Head: Normocephalic and atraumatic.  Eyes: Conjunctivae are normal.  Neck: Normal range of motion.  Cardiovascular: Normal rate.  Respiratory: Effort normal.  GI: Soft.  Musculoskeletal: Normal range of motion.  Neurological: She is alert and oriented to person, place, and time.  Skin: Skin is warm and dry.  Psychiatric: She has a normal mood and affect. Her behavior is normal.    MAU Course  Procedures  MDM Start IV LR Bolus f/b Banana Bag IV Antiemetics Po Challenge  Assessment and Plan  23 year old [id number] at 7.3wks by Unsure LMP Nausea/Vomiting  -Patient unable to void -Will start IV with LR f/b banana bag. -Give Reglan 10mg  IV -Will reassess after completion of fluids  Follow Up (2:18 PM)  -Patient no longer appears distressed, reports feeling better with IV fluids. -Reports  improvement of nausea and has consumed. -Will send script for Reglan 10mg  PO q6hrs, prn sent to pharmacy on file.  -Instructed to take dose of Reglan upon picking up script and initiate bland diet. -Encouraged to obtain primary ob for initiation of PNC. -Encouraged to call or return to MAU if symptoms worsen or with the onset of new symptoms. -Discharged to home in stable condition.  Cherre Robins MSN, CNM 04/30/2018, 12:48 PM

## 2018-04-30 NOTE — Telephone Encounter (Signed)
Patient called and left message on the nurse voicemail line stating she is going on her third day without being able to eat/drink anything. Patient states she feels very weak and wants to know if she can get IV fluids.  Called patient and she states she is at the hospital now and was wanting to know if she could come here for IV fluids because she feels terrible and very weak. Told patient yes they can do that in MAU. Patient verbalized understanding & had no questions.

## 2018-04-30 NOTE — MAU Note (Signed)
Pt presents to MAU with complaints of nausea and vomiting for 2 days. States she has not been able to keep anything down. Lower abdominal cramping. Has an appointment here tomorrow but states she feels too bad to wait until tomorrow. U/S scheduled for tomorrow

## 2018-05-01 ENCOUNTER — Ambulatory Visit (INDEPENDENT_AMBULATORY_CARE_PROVIDER_SITE_OTHER): Payer: Self-pay | Admitting: *Deleted

## 2018-05-01 ENCOUNTER — Ambulatory Visit (HOSPITAL_COMMUNITY): Payer: Self-pay

## 2018-05-01 ENCOUNTER — Ambulatory Visit (HOSPITAL_COMMUNITY)
Admission: RE | Admit: 2018-05-01 | Discharge: 2018-05-01 | Disposition: A | Discharge status: Home / Self Care | Payer: Medicaid Other | Source: Ambulatory Visit | Attending: Obstetrics and Gynecology | Admitting: Obstetrics and Gynecology

## 2018-05-01 DIAGNOSIS — O3680X0 Pregnancy with inconclusive fetal viability, not applicable or unspecified: Secondary | ICD-10-CM | POA: Diagnosis not present

## 2018-05-01 NOTE — Progress Notes (Signed)
Patient ID: Madison Stone, female   DOB: 06/10/1995, 22 y.o.   MRN: [id number] I have reviewed the chart and agree with nursing staff's documentation of this patient's encounter.  Anapaola Kinsel, MD 05/01/2018 1:56 PM    

## 2018-05-01 NOTE — Progress Notes (Signed)
Here for Madison Stone results which were reviewed with Dr. Debroah Loop. Informed patient Madison Stone shows live baby at [redacted]w[redacted]d with EDD  12/24/18 and small subchorionic hemorrhage. Advised to start prenatal care; is already taking prenatal vitamins. Also advised may have scant spotting but if has bleeding needs to be evaluated by provider. She voices understanding.  Aleister Lady,RN

## 2018-05-01 NOTE — Progress Notes (Signed)
Patient ID: Madison Stone, female   DOB: 01/11/1996, 23 y.o.   MRN: [id number] I have reviewed the chart and agree with nursing staff's documentation of this patient's encounter.  Scheryl Darter, MD 05/01/2018 1:56 PM

## 2018-05-15 ENCOUNTER — Other Ambulatory Visit: Payer: Self-pay

## 2018-06-02 ENCOUNTER — Telehealth: Payer: Self-pay | Admitting: Family Medicine

## 2018-06-02 NOTE — Telephone Encounter (Signed)
Patient called to say she would not be able to come to this appointment. She lives in [place], and has asked to be transferred to the office in [facility].

## 2018-06-09 ENCOUNTER — Telehealth: Payer: Self-pay

## 2018-06-09 NOTE — Telephone Encounter (Signed)
Pt called the office stating that she has been having fever, chills ,and coughing up black stuff since last Thursday and and symptoms have gotten worse. Pt advised to go to Urgent care to be seen. Understanding was voiced.

## 2018-06-17 ENCOUNTER — Encounter: Payer: Self-pay | Admitting: Advanced Practice Midwife

## 2018-06-20 ENCOUNTER — Encounter (HOSPITAL_COMMUNITY): Payer: Self-pay | Admitting: *Deleted

## 2018-06-20 ENCOUNTER — Inpatient Hospital Stay (HOSPITAL_COMMUNITY)
Admission: AD | Admit: 2018-06-20 | Discharge: 2018-06-20 | Disposition: A | Discharge status: Home / Self Care | Payer: Medicaid Other | Attending: Obstetrics & Gynecology | Admitting: Obstetrics & Gynecology

## 2018-06-20 ENCOUNTER — Other Ambulatory Visit: Payer: Self-pay

## 2018-06-20 ENCOUNTER — Encounter: Payer: Self-pay | Admitting: Obstetrics and Gynecology

## 2018-06-20 DIAGNOSIS — O99352 Diseases of the nervous system complicating pregnancy, second trimester: Secondary | ICD-10-CM | POA: Diagnosis not present

## 2018-06-20 DIAGNOSIS — Z79899 Other long term (current) drug therapy: Secondary | ICD-10-CM | POA: Diagnosis not present

## 2018-06-20 DIAGNOSIS — O26892 Other specified pregnancy related conditions, second trimester: Secondary | ICD-10-CM

## 2018-06-20 DIAGNOSIS — G43009 Migraine without aura, not intractable, without status migrainosus: Secondary | ICD-10-CM | POA: Insufficient documentation

## 2018-06-20 DIAGNOSIS — Z3A14 14 weeks gestation of pregnancy: Secondary | ICD-10-CM | POA: Diagnosis not present

## 2018-06-20 LAB — URINALYSIS, ROUTINE W REFLEX MICROSCOPIC
Bilirubin Urine: NEGATIVE
Glucose, UA: NEGATIVE mg/dL
Hgb urine dipstick: NEGATIVE
Ketones, ur: 5 mg/dL — AB
Leukocytes,Ua: NEGATIVE
Nitrite: NEGATIVE
Protein, ur: NEGATIVE mg/dL
Specific Gravity, Urine: 1.019 (ref 1.005–1.030)
pH: 6 (ref 5.0–8.0)

## 2018-06-20 MED ORDER — LACTATED RINGERS IV BOLUS
1000.0000 mL | Freq: Once | INTRAVENOUS | Status: AC
  Administered 2018-06-20: 15:00:00 1000 mL via INTRAVENOUS

## 2018-06-20 MED ORDER — METOCLOPRAMIDE HCL 10 MG PO TABS
10.0000 mg | ORAL_TABLET | Freq: Once | ORAL | Status: DC
  Filled 2018-06-20: qty 1

## 2018-06-20 MED ORDER — BUTALBITAL-APAP-CAFFEINE 50-325-40 MG PO TABS: 2.0000 | ORAL_TABLET | Freq: Two times a day (BID) | ORAL | 1 refills | Status: DC | PRN

## 2018-06-20 MED ORDER — DEXAMETHASONE SODIUM PHOSPHATE 10 MG/ML IJ SOLN
10.0000 mg | Freq: Once | INTRAMUSCULAR | Status: AC
  Administered 2018-06-20: 10 mg via INTRAVENOUS
  Filled 2018-06-20: qty 1

## 2018-06-20 MED ORDER — DIPHENHYDRAMINE HCL 50 MG/ML IJ SOLN
25.0000 mg | Freq: Once | INTRAMUSCULAR | Status: AC
  Administered 2018-06-20: 15:00:00 25 mg via INTRAVENOUS
  Filled 2018-06-20: qty 1

## 2018-06-20 MED ORDER — CYCLOBENZAPRINE HCL 10 MG PO TABS: 10.0000 mg | ORAL_TABLET | Freq: Three times a day (TID) | ORAL | 0 refills | Status: DC | PRN

## 2018-06-20 MED ORDER — METOCLOPRAMIDE HCL 5 MG/ML IJ SOLN
10.0000 mg | Freq: Once | INTRAMUSCULAR | Status: AC
  Administered 2018-06-20: 15:00:00 10 mg via INTRAVENOUS
  Filled 2018-06-20: qty 2

## 2018-06-20 NOTE — Discharge Instructions (Signed)

## 2018-06-20 NOTE — MAU Note (Signed)
Pt presents to MAU with complaints of a headache for a week. Denies any pregnancy complaints.

## 2018-06-20 NOTE — MAU Provider Note (Addendum)
History    Patient Madison Stone is a 23 y.o. [phone] at [redacted]w[redacted]d here with complaints of migraine for the past 7 days. She denies VB, dysuria, abnormal discharge, pain with intercourse or other ob-gyn complaints. Her medical history is significant for PCOS. She is a patient at [facility].   CSN: [id number]  Arrival date and time: 06/20/18 1323   First Provider Initiated Contact with Patient 06/20/18 1454      Chief Complaint  Patient presents with  . Headache   Headache   This is a new problem. The current episode started in the past 7 days. The problem occurs constantly. The problem has been unchanged. The pain is located in the left unilateral region. The pain does not radiate. The pain quality is not similar to prior headaches. The quality of the pain is described as aching. The pain is at a severity of 8/10. Associated symptoms include blurred vision, nausea and vomiting. Pertinent negatives include no abdominal pain. The symptoms are aggravated by bright light. She has tried darkened room and NSAIDs for the symptoms. The treatment provided mild relief.    OB History    Gravida  5   Para  3   Term  2   Preterm  1   AB  1   Living  3     SAB  1   TAB  0   Ectopic  0   Multiple  0   Live Births  3           Past Medical History:  Diagnosis Date  . PCOS (polycystic ovarian syndrome)     Past Surgical History:  Procedure Laterality Date  . CHOLECYSTECTOMY    . OVARIAN CYST REMOVAL     3 separate surgeries     History reviewed. No pertinent family history.  Social History   Tobacco Use  . Smoking status: Never Smoker  . Smokeless tobacco: Never Used  Substance Use Topics  . Alcohol use: Not Currently    Frequency: Never  . Drug use: Yes    Types: Marijuana    Comment: last smoked 06/20/2018    Allergies:  Allergies  Allergen Reactions  . Amoxicillin   . Latex     Medications Prior to Admission  Medication Sig Dispense Refill Last Dose  .  Prenatal Vit-Fe Fumarate-FA (PRENATAL VITAMINS PO) Take 1 tablet by mouth daily.   06/19/2018 at Unknown time  . metoCLOPramide (REGLAN) 10 MG tablet Take 1 tablet (10 mg total) by mouth every 6 (six) hours. 28 tablet 0 Taking  . traMADol (ULTRAM) 50 MG tablet Take 1 tablet (50 mg total) by mouth every 8 (eight) hours as needed. 8 tablet 0     Review of Systems  Eyes: Positive for blurred vision.  Gastrointestinal: Positive for nausea and vomiting. Negative for abdominal pain.  Neurological: Positive for headaches.   Physical Exam   Blood pressure 125/69, pulse 71, temperature 98 F (36.7 C), resp. rate 16, height 5\' 1"  (1.549 m), weight 57.6 kg, last menstrual period 03/09/2018, SpO2 100 %.  Physical Exam  Constitutional: She is oriented to person, place, and time. She appears well-developed and well-nourished.  HENT:  Head: Normocephalic.  Neck: Normal range of motion.  GI: Soft.  Musculoskeletal: Normal range of motion.  Neurological: She is alert and oriented to person, place, and time.  Skin: Skin is warm and dry.  Psychiatric: She has a normal mood and affect.    MAU Course  Procedures  MDM --FHR by bedside doppler is 154.  -UA negative for signs of infection.  -IV fluids with Reglan, Benadryl, Decadron -patient feeling much better; pain is now 0/10; desires discharge   Assessment and Plan   1. Migraine without aura and without status migrainosus, not intractable    2. Patient stable for discharge with RX for Fioricet and Flexeril; will message [facility] to have patient see Nada Maclachlan.  3. Return to MAU if her condition were to change or worsen; warning signs reviewed.   Charlesetta Garibaldi Laurina Fischl 06/20/2018, 3:07 PM

## 2018-06-24 ENCOUNTER — Encounter: Payer: Medicaid Other | Admitting: Advanced Practice Midwife

## 2018-06-26 ENCOUNTER — Encounter: Payer: Medicaid Other | Admitting: Family Medicine

## 2018-07-04 ENCOUNTER — Ambulatory Visit (INDEPENDENT_AMBULATORY_CARE_PROVIDER_SITE_OTHER): Payer: Medicaid Other | Admitting: Physician Assistant

## 2018-07-04 ENCOUNTER — Encounter: Payer: Self-pay | Admitting: Physician Assistant

## 2018-07-04 ENCOUNTER — Other Ambulatory Visit: Payer: Self-pay

## 2018-07-04 DIAGNOSIS — G43109 Migraine with aura, not intractable, without status migrainosus: Secondary | ICD-10-CM

## 2018-07-04 DIAGNOSIS — R519 Headache, unspecified: Secondary | ICD-10-CM | POA: Insufficient documentation

## 2018-07-04 DIAGNOSIS — R51 Headache: Secondary | ICD-10-CM

## 2018-07-04 DIAGNOSIS — O26892 Other specified pregnancy related conditions, second trimester: Secondary | ICD-10-CM | POA: Insufficient documentation

## 2018-07-04 MED ORDER — METOCLOPRAMIDE HCL 10 MG PO TABS: 10.0000 mg | ORAL_TABLET | Freq: Three times a day (TID) | ORAL | 2 refills | Status: DC | PRN

## 2018-07-04 MED ORDER — CYCLOBENZAPRINE HCL 10 MG PO TABS: 10.0000 mg | ORAL_TABLET | Freq: Three times a day (TID) | ORAL | 2 refills | Status: DC | PRN

## 2018-07-04 NOTE — Patient Instructions (Signed)

## 2018-07-04 NOTE — Progress Notes (Signed)
TELEHEALTH VIRTUAL HEADACHE VISIT ENCOUNTER NOTE  I connected with Madison Stone on 07/04/18 at 10:00 AM EDT by telephone at home and verified that I am speaking with the correct person using two identifiers.   I discussed the limitations, risks, security and privacy concerns of performing an evaluation and management service by telephone and the availability of in person appointments. I also discussed with the patient that there may be a patient responsible charge related to this service. The patient expressed understanding and agreed to proceed.   History:  Madison Stone is a 23 y.o. [phone] female being evaluated today for evaluation of headache.  The headaches are severe, with lots of pressure, feeling like the head will explode,  bilateral frontal, throbbing, worse with movement.  Lights and noises are a problem.  There are sometimes dark spots prior to the headache (5-10 minutes).  There is dizziness,blurry vision,  tingling in her fingers and back of the neck.  There in numbness in tip of feet and fingers.  These only happen with severe headaches.  The headaches last all day up to an entire week.   They first started a year ago and got worse with this pregnancy.   Her grandmother and other family members have migraine.   She take muscle relaxant and caffeine pill.  Other meds used previously are tylenol, caffeine, BC powder. She is having an average of 5 headache days per week, all severe.   She has been to the ED for headache eval before and gotten the headache cocktail.    Smoking marijuana usually helps the headache but she has not used that since she learned of the pregnancy.   She drinks plenty of water and eats healthy organic foods.   Sex is helpful for headache, even now.     Past Medical History:  Diagnosis Date  . PCOS (polycystic ovarian syndrome)    Past Surgical History:  Procedure Laterality Date  . CHOLECYSTECTOMY    . OVARIAN CYST REMOVAL     3 separate  surgeries    The following portions of the patient's history were reviewed and updated as appropriate: allergies, current medications, past family history, past medical history, past social history, past surgical history and problem list.   Review of Systems:  Pertinent items noted in HPI and remainder of comprehensive ROS otherwise negative.  Physical Exam:   General:  Alert, oriented and cooperative.   Mental Status: Normal mood and affect perceived. Normal judgment and thought content.  Physical exam deferred due to nature of the encounter   Assessment and Plan:     1. Migraine with aura and without status migrainosus, not intractable   2. Pregnancy headache in second trimester     Pt may continue to use Fioricet for acute migraine however it must be limited to 2-3 days per week.   She may begin Reglan po for headache up to TID.  Sedation precautions discussed.   She may use Flexeril po for headache up to TID. Sedation precautions discussed.  OTC benadryl 25mg  up to 50mg  may be used for headache rescue.   Do not combine more than 2 of these medication.  Education regarding maintaining routine with structured sleep/wake/meal times, daily regular exercise, appropriate fluid intake.  Pt also advised she is at increased risk due to presence of aura.  She is counseled not to use Estrogen throughout her lifetime due to this.  She plans for BTL after this pregnancy.   I discussed the assessment  and treatment plan with the patient. The patient was provided an opportunity to ask questions and all were answered. The patient agreed with the plan and demonstrated an understanding of the instructions.   The patient was advised to call back or seek an in-person evaluation/go to the ED if the symptoms worsen or if the condition fails to improve as anticipated.  I provided 30 minutes of non-face-to-face time during this encounter.   Bertram DenverKaren E Teague Clark, PA-C [facility]

## 2018-07-07 ENCOUNTER — Encounter: Payer: Medicaid Other | Admitting: Obstetrics & Gynecology

## 2018-07-08 ENCOUNTER — Telehealth: Payer: Self-pay

## 2018-07-08 NOTE — Telephone Encounter (Signed)
Pt called the office requesting a refill of her Fioricet 50-325-40 mg.Pt states that Flexeril makes her drowsy. Explained to pt that we can not send in a refill until she comes in for her New OB appt. Advised pt to try taking extra strength tylenol.Understanding was voiced.  Jaynie Hitch l Tamy Accardo, CMA

## 2018-07-11 ENCOUNTER — Encounter: Payer: Medicaid Other | Admitting: Family Medicine

## 2018-07-16 ENCOUNTER — Encounter: Payer: Self-pay | Admitting: Obstetrics & Gynecology

## 2018-07-16 ENCOUNTER — Other Ambulatory Visit (HOSPITAL_COMMUNITY)
Admission: RE | Admit: 2018-07-16 | Discharge: 2018-07-16 | Disposition: A | Discharge status: Home / Self Care | Payer: Medicaid Other | Source: Ambulatory Visit | Attending: Obstetrics & Gynecology | Admitting: Obstetrics & Gynecology

## 2018-07-16 ENCOUNTER — Other Ambulatory Visit: Payer: Self-pay

## 2018-07-16 ENCOUNTER — Ambulatory Visit (INDEPENDENT_AMBULATORY_CARE_PROVIDER_SITE_OTHER): Payer: Medicaid Other | Admitting: Obstetrics & Gynecology

## 2018-07-16 DIAGNOSIS — Z3A18 18 weeks gestation of pregnancy: Secondary | ICD-10-CM | POA: Diagnosis not present

## 2018-07-16 DIAGNOSIS — Z3480 Encounter for supervision of other normal pregnancy, unspecified trimester: Secondary | ICD-10-CM | POA: Insufficient documentation

## 2018-07-16 DIAGNOSIS — Z3482 Encounter for supervision of other normal pregnancy, second trimester: Secondary | ICD-10-CM

## 2018-07-16 LAB — POCT URINALYSIS DIPSTICK OB
Bilirubin, UA: NEGATIVE
Glucose, UA: NEGATIVE
Ketones, UA: NEGATIVE
Leukocytes, UA: NEGATIVE
Nitrite, UA: NEGATIVE
Spec Grav, UA: 1.01 (ref 1.010–1.025)
pH, UA: 8 (ref 5.0–8.0)

## 2018-07-16 NOTE — Progress Notes (Signed)
Pt states last pap smear was Aug 2019.

## 2018-07-16 NOTE — Progress Notes (Signed)
  Subjective:    Madison Stone is being seen today for her first obstetrical visit.  This is not a planned pregnancy. She is at [redacted]w[redacted]d gestation. Her obstetrical history is significant for h/o PPROM delivery from 33 weeks. Relationship with FOB: significant other, living together. Patient does intend to breast feed. Pregnancy history fully reviewed.  Patient reports no complaints.  Review of Systems:   Review of Systems  Objective:     BP 112/65   Pulse 82   Wt 152 lb (68.9 kg)   LMP 03/09/2018 (Exact Date)   BMI 28.72 kg/m  Physical Exam  Exam General Appearance:    Alert, cooperative, no distress, appears stated age  Head:    Normocephalic, without obvious abnormality, atraumatic  Eyes:    conjunctiva/corneas clear, EOM's intact, both eyes  Ears:    Normal external ear canals, both ears  Nose:   Nares normal, septum midline, mucosa normal, no drainage    or sinus tenderness  Throat:   Lips, mucosa, and tongue normal; teeth and gums normal  Neck:   Supple, symmetrical, trachea midline, no adenopathy;    thyroid:  no enlargement/tenderness/nodules  Back:     Symmetric, no curvature, ROM normal, no CVA tenderness     Chest Wall:    No tenderness or deformity     Breast Exam:    No tenderness, masses, or nipple abnormality  Abdomen:     Soft, non-tender, bowel sounds active all four quadrants,    no masses, no organomegaly  Genitalia:    Normal female without lesion, discharge or tenderness   18-20 week IUP  Extremities:   Extremities normal, atraumatic, no cyanosis or edema  Pulses:   2+ and symmetric all extremities  Skin:   Skin color, texture, turgor normal, no rashes or lesions      Assessment:    Pregnancy: [id number] Patient Active Problem List   Diagnosis Date Noted  . Supervision of other normal pregnancy, antepartum 07/16/2018  . Migraine with aura and without status migrainosus, not intractable 07/04/2018  . Pregnancy headache in second trimester 07/04/2018        Plan:     Initial labs drawn. Prenatal vitamins. Problem list reviewed and updated. Genetic testing requested  Role of ultrasound in pregnancy discussed; fetal survey: requested. Amniocentesis discussed: not indicated. Follow up in 4 weeks virtually  60% of 30 min visit spent on counseling and coordination of care.  I have reviewed with pt the change in ov related to the Covid pandemic. Pt is being enrolle din [employer].    Madison Stone 07/16/2018

## 2018-07-18 LAB — CYTOLOGY - PAP: Diagnosis: NEGATIVE

## 2018-07-18 LAB — URINE CULTURE, OB REFLEX

## 2018-07-18 LAB — CULTURE, OB URINE

## 2018-07-23 ENCOUNTER — Telehealth: Payer: Self-pay

## 2018-07-23 NOTE — Telephone Encounter (Signed)
Pt called the office requesting Panorama results. Explained to pt that results are not back. Pt got upset and stated that she is having her [place] tomorrow and was told that her results would be back today. Pt made aware that I cannot get on the Panorama site but I will check tomorrow when Victorino Dike gets back. Pt states that it is no point because her [place] is tomorrow.  Dustin Burrill l Husam Hohn, CMA

## 2018-07-25 ENCOUNTER — Other Ambulatory Visit: Payer: Self-pay

## 2018-07-25 ENCOUNTER — Ambulatory Visit (HOSPITAL_COMMUNITY)
Admission: RE | Admit: 2018-07-25 | Discharge: 2018-07-25 | Disposition: A | Discharge status: Home / Self Care | Payer: Medicaid Other | Source: Ambulatory Visit | Attending: Obstetrics and Gynecology | Admitting: Obstetrics and Gynecology

## 2018-07-25 ENCOUNTER — Other Ambulatory Visit (HOSPITAL_COMMUNITY): Payer: Self-pay | Admitting: *Deleted

## 2018-07-25 DIAGNOSIS — Z3480 Encounter for supervision of other normal pregnancy, unspecified trimester: Secondary | ICD-10-CM

## 2018-07-25 DIAGNOSIS — Z3A18 18 weeks gestation of pregnancy: Secondary | ICD-10-CM

## 2018-07-25 DIAGNOSIS — Z3689 Encounter for other specified antenatal screening: Secondary | ICD-10-CM

## 2018-07-25 DIAGNOSIS — Z363 Encounter for antenatal screening for malformations: Secondary | ICD-10-CM

## 2018-07-26 LAB — OBSTETRIC PANEL, INCLUDING HIV
Antibody Screen: NEGATIVE
Basophils Absolute: 0 10*3/uL (ref 0.0–0.2)
Basos: 0 %
EOS (ABSOLUTE): 0.1 10*3/uL (ref 0.0–0.4)
Eos: 1 %
HIV Screen 4th Generation wRfx: NONREACTIVE
Hematocrit: 33.6 % — ABNORMAL LOW (ref 34.0–46.6)
Hemoglobin: 11.9 g/dL (ref 11.1–15.9)
Hepatitis B Surface Ag: NEGATIVE
Immature Grans (Abs): 0 10*3/uL (ref 0.0–0.1)
Immature Granulocytes: 0 %
Lymphocytes Absolute: 2.7 10*3/uL (ref 0.7–3.1)
Lymphs: 30 %
MCH: 31.2 pg (ref 26.6–33.0)
MCHC: 35.4 g/dL (ref 31.5–35.7)
MCV: 88 fL (ref 79–97)
Monocytes Absolute: 0.4 10*3/uL (ref 0.1–0.9)
Monocytes: 5 %
Neutrophils Absolute: 5.8 10*3/uL (ref 1.4–7.0)
Neutrophils: 64 %
Platelets: 322 10*3/uL (ref 150–450)
RBC: 3.81 x10E6/uL (ref 3.77–5.28)
RDW: 13.1 % (ref 11.7–15.4)
RPR Ser Ql: NONREACTIVE
Rh Factor: POSITIVE
Rubella Antibodies, IGG: 3.5 index (ref >0.99)
WBC: 9 10*3/uL (ref 3.4–10.8)

## 2018-07-26 LAB — HEMOGLOBINOPATHY EVALUATION
Ferritin: 91 ng/mL (ref 15–150)
Hgb A2 Quant: 2.2 % (ref 1.8–3.2)
Hgb A: 97.8 % (ref 96.4–98.8)
Hgb C: 0 %
Hgb F Quant: 0 % (ref 0.0–2.0)
Hgb S: 0 %
Hgb Solubility: NEGATIVE
Hgb Variant: 0 %

## 2018-07-26 LAB — SMN1 COPY NUMBER ANALYSIS (SMA CARRIER SCREENING)

## 2018-08-14 ENCOUNTER — Ambulatory Visit (INDEPENDENT_AMBULATORY_CARE_PROVIDER_SITE_OTHER): Payer: Medicaid Other | Admitting: Family Medicine

## 2018-08-14 VITALS — BP 127/71 | HR 90

## 2018-08-14 DIAGNOSIS — Z3A22 22 weeks gestation of pregnancy: Secondary | ICD-10-CM

## 2018-08-14 DIAGNOSIS — O219 Vomiting of pregnancy, unspecified: Secondary | ICD-10-CM

## 2018-08-14 DIAGNOSIS — Z3480 Encounter for supervision of other normal pregnancy, unspecified trimester: Secondary | ICD-10-CM

## 2018-08-14 MED ORDER — METOCLOPRAMIDE HCL 10 MG PO TABS: 10.0000 mg | ORAL_TABLET | Freq: Three times a day (TID) | ORAL | 3 refills | Status: DC | PRN

## 2018-08-14 NOTE — Progress Notes (Signed)
   PRENATAL VISIT NOTE  Subjective:  Madison Stone is a 23 y.o. [phone] at 108w4d being seen today for ongoing prenatal care.  She is currently monitored for the following issues for this high-risk pregnancy and has Migraine with aura and without status migrainosus, not intractable; Pregnancy headache in second trimester; Supervision of other normal pregnancy, antepartum; and Low birth weight or preterm infant, 1750-1999 grams on their problem list.  Patient reports no complaints.  Contractions: Not present. Vag. Bleeding: None.  Movement: Present. Denies leaking of fluid.   The following portions of the patient's history were reviewed and updated as appropriate: allergies, current medications, past family history, past medical history, past social history, past surgical history and problem list.   Objective:   Vitals:   08/14/18 1359  BP: 127/71  Pulse: 90    Fetal Status:     Movement: Present     General:  Alert, oriented and cooperative. Patient is in no acute distress.  Skin: Skin is warm and dry. No rash noted.   Cardiovascular: Normal heart rate noted  Respiratory: Normal respiratory effort, no problems with respiration noted  Abdomen: Soft, gravid, appropriate for gestational age.  Pain/Pressure: Present     Pelvic: Cervical exam deferred        Extremities: Normal range of motion.     Mental Status: Normal mood and affect. Normal behavior. Normal judgment and thought content.   Assessment and Plan:  Pregnancy: [id number] at [redacted]w[redacted]d 1. Supervision of other normal pregnancy, antepartum FM normal  2. Nausea and vomiting in pregnancy prior to [redacted] weeks gestation reglan refilled. Patient to use pedialyte to keep hydrated.  Preterm labor symptoms and general obstetric precautions including but not limited to vaginal bleeding, contractions, leaking of fluid and fetal movement were reviewed in detail with the patient. Please refer to After Visit Summary for other counseling  recommendations.   Return in about 4 weeks (around 09/11/2018) for OB f/u, 2 hr GTT, In Office.  Future Appointments  Date Time Provider Department Center  08/25/2018  8:15 AM [facility] [place] 4 [facility]    Levie Heritage, DO

## 2018-08-25 ENCOUNTER — Ambulatory Visit (HOSPITAL_COMMUNITY)
Admission: RE | Admit: 2018-08-25 | Discharge: 2018-08-25 | Disposition: A | Discharge status: Home / Self Care | Payer: Medicaid Other | Source: Ambulatory Visit | Attending: Obstetrics and Gynecology | Admitting: Obstetrics and Gynecology

## 2018-08-25 ENCOUNTER — Other Ambulatory Visit: Payer: Self-pay

## 2018-08-25 DIAGNOSIS — Z3689 Encounter for other specified antenatal screening: Secondary | ICD-10-CM | POA: Diagnosis not present

## 2018-08-25 DIAGNOSIS — Z3A22 22 weeks gestation of pregnancy: Secondary | ICD-10-CM | POA: Diagnosis not present

## 2018-08-25 DIAGNOSIS — Z362 Encounter for other antenatal screening follow-up: Secondary | ICD-10-CM

## 2018-09-08 ENCOUNTER — Other Ambulatory Visit: Payer: Self-pay

## 2018-09-08 ENCOUNTER — Telehealth: Payer: Self-pay

## 2018-09-08 ENCOUNTER — Ambulatory Visit (INDEPENDENT_AMBULATORY_CARE_PROVIDER_SITE_OTHER): Payer: Medicaid Other | Admitting: Family Medicine

## 2018-09-08 VITALS — BP 110/60 | HR 76 | Wt 154.0 lb

## 2018-09-08 DIAGNOSIS — O9989 Other specified diseases and conditions complicating pregnancy, childbirth and the puerperium: Secondary | ICD-10-CM

## 2018-09-08 DIAGNOSIS — M9902 Segmental and somatic dysfunction of thoracic region: Secondary | ICD-10-CM | POA: Diagnosis not present

## 2018-09-08 DIAGNOSIS — Z3482 Encounter for supervision of other normal pregnancy, second trimester: Secondary | ICD-10-CM | POA: Diagnosis not present

## 2018-09-08 DIAGNOSIS — Z3A26 26 weeks gestation of pregnancy: Secondary | ICD-10-CM

## 2018-09-08 DIAGNOSIS — M549 Dorsalgia, unspecified: Secondary | ICD-10-CM

## 2018-09-08 DIAGNOSIS — Z3480 Encounter for supervision of other normal pregnancy, unspecified trimester: Secondary | ICD-10-CM

## 2018-09-08 NOTE — Telephone Encounter (Signed)
[employer] called and states that patient made it through her fasting and one hour blood draw for the gtt.  Instructed [facility] rep. Mary to send those to values. Will route to provider. Kathrene Alu RN

## 2018-09-08 NOTE — Progress Notes (Signed)
   PRENATAL VISIT NOTE  Subjective:  Madison Stone is a 23 y.o. [phone] at [redacted]w[redacted]d being seen today for ongoing prenatal care.  She is currently monitored for the following issues for this high-risk pregnancy and has Migraine with aura and without status migrainosus, not intractable; Pregnancy headache in second trimester; Supervision of other normal pregnancy, antepartum; and Low birth weight or preterm infant, 1750-1999 grams on their problem list.  Patient reports backache - between shoulders and in lower back. Start a few days ago.  Contractions: Not present. Vag. Bleeding: None.  Movement: Present. Denies leaking of fluid.   The following portions of the patient's history were reviewed and updated as appropriate: allergies, current medications, past family history, past medical history, past social history, past surgical history and problem list. Problem list updated.  Objective:   Vitals:   09/08/18 0818  BP: 110/60  Pulse: 76  Weight: 154 lb (69.9 kg)    Fetal Status: Fetal Heart Rate (bpm): 153 Fundal Height: 27 cm Movement: Present     General:  Alert, oriented and cooperative. Patient is in no acute distress.  Skin: Skin is warm and dry. No rash noted.   Cardiovascular: Normal heart rate noted  Respiratory: Normal respiratory effort, no problems with respiration noted  Abdomen: Soft, gravid, appropriate for gestational age. Pain/Pressure: Present     Pelvic:  Cervical exam deferred        MSK: Restriction, tenderness, tissue texture changes, and paraspinal spasm in the lumbar and thoracic spine  Neuro: Moves all four extremities with no focal neurological deficit  Extremities: Normal range of motion.  Edema: Trace  Mental Status: Normal mood and affect. Normal behavior. Normal judgment and thought content.   OSE: Head   Cervical   Thoracic T5 FSRR  Rib Right 4 rib inhaled  Lumbar L1 ESRR, L5 ESRL  Sacrum L/L  Pelvis R ant innom    Assessment and Plan:  Pregnancy:  [id number] at [redacted]w[redacted]d  1. Supervision of other normal pregnancy, antepartum FHT and FH normal - CBC - Glucose Tolerance, 2 Hours w/1 Hour - HIV Antibody (routine testing w rflx) - RPR  2. Back pain in pregnancy 3. Thoracic region somatic dysfunction OMT done after patient permission. HVLA technique utilized. 5 areas treated with improvement of tissue texture and joint mobility. Patient tolerated procedure well.     Preterm labor symptoms and general obstetric precautions including but not limited to vaginal bleeding, contractions, leaking of fluid and fetal movement were reviewed in detail with the patient. Please refer to After Visit Summary for other counseling recommendations.  Return in about 4 weeks (around 10/06/2018) for OB f/u, In Office.  Truett Mainland, DO

## 2018-09-09 ENCOUNTER — Encounter: Payer: Medicaid Other | Admitting: Advanced Practice Midwife

## 2018-09-09 NOTE — Telephone Encounter (Signed)
Will see what values are. May need to have patient check CBGs for a couple of weeks.

## 2018-09-11 LAB — CBC
Hematocrit: 35 % (ref 34.0–46.6)
Hemoglobin: 11.7 g/dL (ref 11.1–15.9)
MCH: 31.7 pg (ref 26.6–33.0)
MCHC: 33.4 g/dL (ref 31.5–35.7)
MCV: 95 fL (ref 79–97)
Platelets: 273 10*3/uL (ref 150–450)
RBC: 3.69 x10E6/uL — ABNORMAL LOW (ref 3.77–5.28)
RDW: 12.6 % (ref 11.7–15.4)
WBC: 8.8 10*3/uL (ref 3.4–10.8)

## 2018-09-11 LAB — GLUCOSE TOLERANCE, 2 HOURS W/ 1HR
Glucose, 1 hour: 60 mg/dL — ABNORMAL LOW (ref 65–179)
Glucose, Fasting: 74 mg/dL (ref 65–91)

## 2018-09-11 LAB — RPR: RPR Ser Ql: NONREACTIVE

## 2018-09-11 LAB — HIV ANTIBODY (ROUTINE TESTING W REFLEX): HIV Screen 4th Generation wRfx: NONREACTIVE

## 2018-09-30 ENCOUNTER — Telehealth: Payer: Self-pay

## 2018-09-30 ENCOUNTER — Telehealth: Payer: Self-pay | Admitting: *Deleted

## 2018-09-30 DIAGNOSIS — Z20822 Contact with and (suspected) exposure to covid-19: Secondary | ICD-10-CM

## 2018-09-30 NOTE — Telephone Encounter (Signed)
Patient returning call to be scheduled for Covid-19 testing .  Patient scheduled for 10/01/18

## 2018-09-30 NOTE — Telephone Encounter (Addendum)
Attempted to contact pt to schedule testing; left message on voicemail for pt to return call to [phone]; orders placed per protocol. ----- Message from Doran Heater, RN sent at 09/30/2018 12:56 PM EDT ----- Patient needs to be tested per Dr. Lavonia Drafts [facility] for [facility]) Do we just need to send her to [place] for testing?  Thanks Kathrene Alu [employer] for [employer]

## 2018-09-30 NOTE — Telephone Encounter (Signed)
Opened in Error.

## 2018-10-01 ENCOUNTER — Other Ambulatory Visit: Payer: Medicaid Other

## 2018-10-01 DIAGNOSIS — Z20822 Contact with and (suspected) exposure to covid-19: Secondary | ICD-10-CM

## 2018-10-07 ENCOUNTER — Encounter: Payer: Self-pay | Admitting: Advanced Practice Midwife

## 2018-10-07 ENCOUNTER — Other Ambulatory Visit: Payer: Self-pay

## 2018-10-07 ENCOUNTER — Ambulatory Visit (INDEPENDENT_AMBULATORY_CARE_PROVIDER_SITE_OTHER): Payer: Medicaid Other | Admitting: Advanced Practice Midwife

## 2018-10-07 VITALS — BP 104/60 | HR 88 | Wt 154.0 lb

## 2018-10-07 DIAGNOSIS — Z20828 Contact with and (suspected) exposure to other viral communicable diseases: Secondary | ICD-10-CM

## 2018-10-07 DIAGNOSIS — O26893 Other specified pregnancy related conditions, third trimester: Secondary | ICD-10-CM

## 2018-10-07 DIAGNOSIS — R202 Paresthesia of skin: Secondary | ICD-10-CM

## 2018-10-07 DIAGNOSIS — Z8751 Personal history of pre-term labor: Secondary | ICD-10-CM

## 2018-10-07 DIAGNOSIS — R195 Other fecal abnormalities: Secondary | ICD-10-CM

## 2018-10-07 DIAGNOSIS — Z20822 Contact with and (suspected) exposure to covid-19: Secondary | ICD-10-CM

## 2018-10-07 DIAGNOSIS — R200 Anesthesia of skin: Secondary | ICD-10-CM

## 2018-10-07 DIAGNOSIS — Z3A30 30 weeks gestation of pregnancy: Secondary | ICD-10-CM

## 2018-10-07 NOTE — Progress Notes (Signed)
   PRENATAL VISIT NOTE  Subjective:  Madison Stone is a 23 y.o. [phone] at [redacted]w[redacted]d being seen today for ongoing prenatal care.  She is currently monitored for the following issues for this high-risk pregnancy and has Migraine with aura and without status migrainosus, not intractable; Pregnancy headache in second trimester; Supervision of other normal pregnancy, antepartum; Low birth weight or preterm infant, 1750-1999 grams; and History of preterm delivery on their problem list.  Patient reports occasional contractions. Loose stools (4/day) all week, URI sx better, right leg tingly.  Never went for COVID test.   Contractions: Irritability. Vag. Bleeding: None.  Movement: Present. Denies leaking of fluid.   The following portions of the patient's history were reviewed and updated as appropriate: allergies, current medications, past family history, past medical history, past social history, past surgical history and problem list.   Objective:   Vitals:   10/07/18 0836  BP: 104/60  Pulse: 88  Weight: 69.9 kg    Fetal Status: Fetal Heart Rate (bpm): 152 Fundal Height: 33 cm Movement: Present  Presentation: Undeterminable  General:  Alert, oriented and cooperative. Patient is in no acute distress.  Skin: Skin is warm and dry. No rash noted.   Cardiovascular: Normal heart rate noted  Respiratory: Normal respiratory effort, no problems with respiration noted  Abdomen: Soft, gravid, appropriate for gestational age.  Pain/Pressure: Present     Pelvic: Cervical exam performed Dilation: Closed Effacement (%): Thick Station: Ballotable  Extremities: Normal range of motion.  Edema: Trace  Mental Status: Normal mood and affect. Normal behavior. Normal judgment and thought content.   Assessment and Plan:  Pregnancy: [id number] at [redacted]w[redacted]d 1. History of preterm delivery     Not on Makena, feels some pressure at times, but no painful contractions     Requested cervix test.  Long and closed, high  2.   Loose stools      Encouraged to go get her COVID test today,   3.  Right leg numbness      Probably positional, mechanics discussed  Preterm labor symptoms and general obstetric precautions including but not limited to vaginal bleeding, contractions, leaking of fluid and fetal movement were reviewed in detail with the patient. Please refer to After Visit Summary for other counseling recommendations.   Return in about 2 weeks (around 10/21/2018) for [facility].  Future Appointments  Date Time Provider [place]  10/21/2018  8:30 AM Seabron Spates, CNM [facility] None    Hansel Feinstein, CNM

## 2018-10-07 NOTE — Progress Notes (Deleted)
  Subjective:    Madison Stone is being seen today for her first obstetrical visit.  This {is/is not:9024} a planned pregnancy. She is at 109w2d gestation. Her obstetrical history is significant for {ob risk factors:10154}. Relationship with FOB: {fob:16621}. Patient {does/does not:19097} intend to breast feed. Pregnancy history fully reviewed.  Patient reports {sx:14538}.  Review of Systems:   Review of Systems  Objective:     BP 104/60   Pulse 88   Wt 69.9 kg   LMP 03/09/2018 (Exact Date)   BMI 29.10 kg/m  Physical Exam  Exam    Assessment:    Pregnancy: [id number] Patient Active Problem List   Diagnosis Date Noted  . History of preterm delivery 10/07/2018  . Supervision of other normal pregnancy, antepartum 07/16/2018  . Low birth weight or preterm infant, 1750-1999 grams 07/16/2018  . Migraine with aura and without status migrainosus, not intractable 07/04/2018  . Pregnancy headache in second trimester 07/04/2018       Plan:     Initial labs drawn. Prenatal vitamins. Problem list reviewed and updated. AFP3 discussed: {requests/ordered/declines:14581}. Role of ultrasound in pregnancy discussed; fetal survey: {requests/ordered/declines:14581}. Amniocentesis discussed: {amniocentesis:14582}. Follow up in {numbers 0-4:31231} weeks. ***% of *** min visit spent on counseling and coordination of care.  ***   Hansel Feinstein 10/07/2018

## 2018-10-07 NOTE — Patient Instructions (Signed)
Preterm Labor and Birth Information °Pregnancy normally lasts 39-41 weeks. Preterm labor is when labor starts early. It starts before you have been pregnant for 37 whole weeks. °What are the risk factors for preterm labor? °Preterm labor is more likely to occur in women who: °· Have an infection while pregnant. °· Have a cervix that is short. °· Have gone into preterm labor before. °· Have had surgery on their cervix. °· Are younger than age 24. °· Are older than age 35. °· Are African American. °· Are pregnant with two or more babies. °· Take street drugs while pregnant. °· Smoke while pregnant. °· Do not gain enough weight while pregnant. °· Got pregnant right after another pregnancy. °What are the symptoms of preterm labor? °Symptoms of preterm labor include: °· Cramps. The cramps may feel like the cramps some women get during their period. The cramps may happen with watery poop (diarrhea). °· Pain in the belly (abdomen). °· Pain in the lower back. °· Regular contractions or tightening. It may feel like your belly is getting tighter. °· Pressure in the lower belly that seems to get stronger. °· More fluid (discharge) leaking from the vagina. The fluid may be watery or bloody. °· Water breaking. °Why is it important to notice signs of preterm labor? °Babies who are born early may not be fully developed. They have a higher chance for: °· Long-term heart problems. °· Long-term lung problems. °· Trouble controlling body systems, like breathing. °· Bleeding in the brain. °· A condition called cerebral palsy. °· Learning difficulties. °· Death. °These risks are highest for babies who are born before 34 weeks of pregnancy. °How is preterm labor treated? °Treatment depends on: °· How long you were pregnant. °· Your condition. °· The health of your baby. °Treatment may involve: °· Having a stitch (suture) placed in your cervix. When you give birth, your cervix opens so the baby can come out. The stitch keeps the cervix  from opening too soon. °· Staying at the hospital. °· Taking or getting medicines, such as: °? Hormone medicines. °? Medicines to stop contractions. °? Medicines to help the baby’s lungs develop. °? Medicines to prevent your baby from having cerebral palsy. °What should I do if I am in preterm labor? °If you think you are going into labor too soon, call your doctor right away. °How can I prevent preterm labor? °· Do not use any tobacco products. °? Examples of these are cigarettes, chewing tobacco, and e-cigarettes. °? If you need help quitting, ask your doctor. °· Do not use street drugs. °· Do not use any medicines unless you ask your doctor if they are safe for you. °· Talk with your doctor before taking any herbal supplements. °· Make sure you gain enough weight. °· Watch for infection. If you think you might have an infection, get it checked right away. °· If you have gone into preterm labor before, tell your doctor. °This information is not intended to replace advice given to you by your health care provider. Make sure you discuss any questions you have with your health care provider. °Document Released: 06/01/2008 Document Revised: 06/27/2018 Document Reviewed: 07/27/2015 °Elsevier Patient Education © 2020 [employer] Inc[employer] ° °

## 2018-10-09 LAB — NOVEL CORONAVIRUS, NAA: SARS-CoV-2, NAA: NOT DETECTED

## 2018-10-12 ENCOUNTER — Encounter: Payer: Self-pay | Admitting: Advanced Practice Midwife

## 2018-10-21 ENCOUNTER — Encounter: Payer: Self-pay | Admitting: Advanced Practice Midwife

## 2018-10-21 ENCOUNTER — Ambulatory Visit (INDEPENDENT_AMBULATORY_CARE_PROVIDER_SITE_OTHER): Payer: Medicaid Other | Admitting: Advanced Practice Midwife

## 2018-10-21 ENCOUNTER — Other Ambulatory Visit: Payer: Self-pay

## 2018-10-21 VITALS — BP 112/66 | HR 83 | Wt 151.0 lb

## 2018-10-21 DIAGNOSIS — Z3483 Encounter for supervision of other normal pregnancy, third trimester: Secondary | ICD-10-CM

## 2018-10-21 DIAGNOSIS — Z3480 Encounter for supervision of other normal pregnancy, unspecified trimester: Secondary | ICD-10-CM

## 2018-10-21 DIAGNOSIS — Z3A32 32 weeks gestation of pregnancy: Secondary | ICD-10-CM

## 2018-10-21 DIAGNOSIS — R195 Other fecal abnormalities: Secondary | ICD-10-CM

## 2018-10-21 NOTE — Patient Instructions (Signed)

## 2018-10-21 NOTE — Progress Notes (Signed)
   PRENATAL VISIT NOTE  Subjective:  Madison Stone is a 23 y.o. [phone] at [redacted]w[redacted]d being seen today for ongoing prenatal care.  She is currently monitored for the following issues for this low-risk pregnancy and has Migraine with aura and without status migrainosus, not intractable; Pregnancy headache in second trimester; Supervision of other normal pregnancy, antepartum; Low birth weight or preterm infant, 1750-1999 grams; and History of preterm delivery on their problem list.  Patient reports no complaints.  Contractions: Irritability. Vag. Bleeding: None.  Movement: Present. Denies leaking of fluid.  Covid test was NEGATIVE on 10/07/2018   Still has loose stools, 4/day.  . Discussed stool cultures but she declines. Wants to wait until Postpartum to see if it resolves. No cramping or other symptoms.    The following portions of the patient's history were reviewed and updated as appropriate: allergies, current medications, past family history, past medical history, past social history, past surgical history and problem list.   Objective:   Vitals:   10/21/18 0852  BP: 112/66  Pulse: 83  Weight: 68.5 kg    Fetal Status: Fetal Heart Rate (bpm): 150   Movement: Present     General:  Alert, oriented and cooperative. Patient is in no acute distress.  Skin: Skin is warm and dry. No rash noted.   Cardiovascular: Normal heart rate noted  Respiratory: Normal respiratory effort, no problems with respiration noted  Abdomen: Soft, gravid, appropriate for gestational age.  Pain/Pressure: Present     Pelvic: Cervical exam deferred        Extremities: Normal range of motion.  Edema: Trace  Mental Status: Normal mood and affect. Normal behavior. Normal judgment and thought content.   Assessment and Plan:  Pregnancy: [id number] at [redacted]w[redacted]d                       Loose stools Declines stool culture.  Wants to wait. . Preterm labor symptoms and general obstetric precautions including but not limited to  vaginal bleeding, contractions, leaking of fluid and fetal movement were reviewed in detail with the patient. Please refer to After Visit Summary for other counseling recommendations.   Return in about 2 weeks (around 11/04/2018) for [facility].   Hansel Feinstein, CNM

## 2018-10-23 ENCOUNTER — Ambulatory Visit (INDEPENDENT_AMBULATORY_CARE_PROVIDER_SITE_OTHER): Payer: Medicaid Other | Admitting: Obstetrics & Gynecology

## 2018-10-23 ENCOUNTER — Other Ambulatory Visit: Payer: Self-pay

## 2018-10-23 VITALS — BP 100/52 | HR 80 | Wt 151.0 lb

## 2018-10-23 DIAGNOSIS — R51 Headache: Secondary | ICD-10-CM

## 2018-10-23 DIAGNOSIS — Z8751 Personal history of pre-term labor: Secondary | ICD-10-CM

## 2018-10-23 DIAGNOSIS — O26892 Other specified pregnancy related conditions, second trimester: Secondary | ICD-10-CM

## 2018-10-23 DIAGNOSIS — Z3A32 32 weeks gestation of pregnancy: Secondary | ICD-10-CM

## 2018-10-23 DIAGNOSIS — O26893 Other specified pregnancy related conditions, third trimester: Secondary | ICD-10-CM

## 2018-10-23 DIAGNOSIS — G43109 Migraine with aura, not intractable, without status migrainosus: Secondary | ICD-10-CM

## 2018-10-23 DIAGNOSIS — O09213 Supervision of pregnancy with history of pre-term labor, third trimester: Secondary | ICD-10-CM | POA: Diagnosis not present

## 2018-10-23 DIAGNOSIS — Z3480 Encounter for supervision of other normal pregnancy, unspecified trimester: Secondary | ICD-10-CM

## 2018-10-23 NOTE — Progress Notes (Signed)
Patient complaining of back pain and abdominal pain that has increased in last 12 hours. Patient states she doesn't think her waterbroke but it is "moist down there". Kathrene Alu RN

## 2018-10-23 NOTE — Progress Notes (Signed)
   PRENATAL VISIT NOTE  Subjective:  Madison Stone is a 23 y.o. [phone] at [redacted]w[redacted]d being seen today for ongoing prenatal care.  She is currently monitored for the following issues for this high-risk pregnancy and has Migraine with aura and without status migrainosus, not intractable; Pregnancy headache in second trimester; Supervision of other normal pregnancy, antepartum; Low birth weight or preterm infant, 1750-1999 grams; and History of preterm delivery on their problem list.  Patient reports freq contractions.  Contractions: Irritability. Vag. Bleeding: None.  Movement: Present. Denies leaking of fluid.   The following portions of the patient's history were reviewed and updated as appropriate: allergies, current medications, past family history, past medical history, past social history, past surgical history and problem list.   Objective:   Vitals:   10/23/18 1335  BP: (!) 100/52  Pulse: 80  Weight: 151 lb (68.5 kg)    Fetal Status:     Movement: Present     General:  Alert, oriented and cooperative. Patient is in no acute distress.  Skin: Skin is warm and dry. No rash noted.   Cardiovascular: Normal heart rate noted  Respiratory: Normal respiratory effort, no problems with respiration noted  Abdomen: Soft, gravid, appropriate for gestational age.  Pain/Pressure: Present     Pelvic: Cervical exam performed        Extremities: Normal range of motion.  Edema: Trace  Mental Status: Normal mood and affect. Normal behavior. Normal judgment and thought content.   Assessment and Plan:  Pregnancy: [id number] at [redacted]w[redacted]d 1. Supervision of other normal pregnancy, antepartum Toco; NO ctx reactive NST  2. Pregnancy headache in second trimester  3. Migraine with aura and without status migrainosus, not intractable  4. Low birth weight or preterm infant, 1750-1999 grams  5. History of preterm delivery Amnisure neg  Preterm labor symptoms and general obstetric precautions including but  not limited to vaginal bleeding, contractions, leaking of fluid and fetal movement were reviewed in detail with the patient. Please refer to After Visit Summary for other counseling recommendations.   No follow-ups on file.  Future Appointments  Date Time Provider Department Center  11/04/2018  9:45 AM Seabron Spates, CNM [facility] None    Lavonia Drafts, MD

## 2018-10-25 LAB — URINE CULTURE, OB REFLEX

## 2018-10-25 LAB — CULTURE, OB URINE

## 2018-10-27 ENCOUNTER — Encounter: Payer: Self-pay | Admitting: Advanced Practice Midwife

## 2018-11-04 ENCOUNTER — Encounter: Payer: Self-pay | Admitting: Advanced Practice Midwife

## 2018-11-04 ENCOUNTER — Ambulatory Visit (INDEPENDENT_AMBULATORY_CARE_PROVIDER_SITE_OTHER): Payer: Medicaid Other | Admitting: Advanced Practice Midwife

## 2018-11-04 VITALS — BP 118/70 | HR 82 | Wt 152.0 lb

## 2018-11-04 DIAGNOSIS — Z3483 Encounter for supervision of other normal pregnancy, third trimester: Secondary | ICD-10-CM

## 2018-11-04 DIAGNOSIS — Z3A34 34 weeks gestation of pregnancy: Secondary | ICD-10-CM

## 2018-11-04 DIAGNOSIS — Z3480 Encounter for supervision of other normal pregnancy, unspecified trimester: Secondary | ICD-10-CM

## 2018-11-04 NOTE — Patient Instructions (Signed)

## 2018-11-04 NOTE — Progress Notes (Signed)
   PRENATAL VISIT NOTE  Subjective:  Madison Stone is a 23 y.o. [phone] at [redacted]w[redacted]d being seen today for ongoing prenatal care.  She is currently monitored for the following issues for this low-risk pregnancy and has Migraine with aura and without status migrainosus, not intractable; Pregnancy headache in second trimester; Supervision of other normal pregnancy, antepartum; Low birth weight or preterm infant, 1750-1999 grams; and History of preterm delivery on their problem list.  Patient reports no complaints.   . Vag. Bleeding: None.  Movement: Present. Denies leaking of fluid.   The following portions of the patient's history were reviewed and updated as appropriate: allergies, current medications, past family history, past medical history, past social history, past surgical history and problem list.   Objective:   Vitals:   11/04/18 0949  BP: 118/70  Pulse: 82  Weight: 68.9 kg    Fetal Status: Fetal Heart Rate (bpm): 150   Movement: Present     General:  Alert, oriented and cooperative. Patient is in no acute distress.  Skin: Skin is warm and dry. No rash noted.   Cardiovascular: Normal heart rate noted  Respiratory: Normal respiratory effort, no problems with respiration noted  Abdomen: Soft, gravid, appropriate for gestational age.  Pain/Pressure: Present     Pelvic: Cervical exam deferred        Extremities: Normal range of motion.  Edema: Trace  Mental Status: Normal mood and affect. Normal behavior. Normal judgment and thought content.   Assessment and Plan:  Pregnancy: [id number] at [redacted]w[redacted]d . Preterm labor symptoms and general obstetric precautions including but not limited to vaginal bleeding, contractions, leaking of fluid and fetal movement were reviewed in detail with the patient. Please refer to After Visit Summary for other counseling recommendations.   RTO 2 weeks  Hansel Feinstein, CNM

## 2018-11-05 ENCOUNTER — Encounter: Payer: Self-pay | Admitting: Advanced Practice Midwife

## 2018-11-06 ENCOUNTER — Inpatient Hospital Stay (HOSPITAL_COMMUNITY)
Admission: AD | Admit: 2018-11-06 | Discharge: 2018-11-06 | Disposition: A | Discharge status: Home / Self Care | Payer: Medicaid Other | Attending: Obstetrics and Gynecology | Admitting: Obstetrics and Gynecology

## 2018-11-06 ENCOUNTER — Other Ambulatory Visit: Payer: Self-pay

## 2018-11-06 ENCOUNTER — Encounter (HOSPITAL_COMMUNITY): Payer: Self-pay | Admitting: *Deleted

## 2018-11-06 DIAGNOSIS — Z880 Allergy status to penicillin: Secondary | ICD-10-CM | POA: Insufficient documentation

## 2018-11-06 DIAGNOSIS — Z91040 Latex allergy status: Secondary | ICD-10-CM | POA: Insufficient documentation

## 2018-11-06 DIAGNOSIS — Z87442 Personal history of urinary calculi: Secondary | ICD-10-CM | POA: Diagnosis not present

## 2018-11-06 DIAGNOSIS — O4703 False labor before 37 completed weeks of gestation, third trimester: Secondary | ICD-10-CM | POA: Diagnosis not present

## 2018-11-06 DIAGNOSIS — R109 Unspecified abdominal pain: Secondary | ICD-10-CM | POA: Diagnosis present

## 2018-11-06 DIAGNOSIS — Z3A34 34 weeks gestation of pregnancy: Secondary | ICD-10-CM

## 2018-11-06 DIAGNOSIS — Z87891 Personal history of nicotine dependence: Secondary | ICD-10-CM | POA: Diagnosis not present

## 2018-11-06 DIAGNOSIS — Z3480 Encounter for supervision of other normal pregnancy, unspecified trimester: Secondary | ICD-10-CM

## 2018-11-06 HISTORY — DX: Calculus of kidney: N20.0

## 2018-11-06 HISTORY — DX: Cardiac murmur, unspecified: R01.1

## 2018-11-06 HISTORY — DX: Unspecified infectious disease: B99.9

## 2018-11-06 HISTORY — DX: Unspecified ovarian cyst, unspecified side: N83.209

## 2018-11-06 HISTORY — DX: Personal history of (healed) traumatic fracture: Z87.81

## 2018-11-06 LAB — URINALYSIS, ROUTINE W REFLEX MICROSCOPIC
Bilirubin Urine: NEGATIVE
Glucose, UA: NEGATIVE mg/dL
Hgb urine dipstick: NEGATIVE
Ketones, ur: 20 mg/dL — AB
Leukocytes,Ua: NEGATIVE
Nitrite: NEGATIVE
Protein, ur: NEGATIVE mg/dL
Specific Gravity, Urine: 1.013 (ref 1.005–1.030)
pH: 8 (ref 5.0–8.0)

## 2018-11-06 LAB — WET PREP, GENITAL
Clue Cells Wet Prep HPF POC: NONE SEEN
Sperm: NONE SEEN
Trich, Wet Prep: NONE SEEN
Yeast Wet Prep HPF POC: NONE SEEN

## 2018-11-06 MED ORDER — CYCLOBENZAPRINE HCL 10 MG PO TABS
10.0000 mg | ORAL_TABLET | Freq: Once | ORAL | Status: AC
  Administered 2018-11-06: 10 mg via ORAL
  Filled 2018-11-06: qty 1

## 2018-11-06 MED ORDER — ONDANSETRON 4 MG PO TBDP
4.0000 mg | ORAL_TABLET | Freq: Once | ORAL | Status: AC
  Administered 2018-11-06: 4 mg via ORAL
  Filled 2018-11-06: qty 1

## 2018-11-06 NOTE — MAU Provider Note (Signed)
History     CSN: [id number]  Arrival date and time: 11/06/18 1137   First Provider Initiated Contact with Patient 11/06/18 1229      Chief Complaint  Patient presents with  . Contractions   Madison KnudsenShakeema Stone is a 23 y.o. [id number] at 5715w4d who receives care at [facility].  She presents today for Contractions that started last night.  Patient states she has been having contractions since last night, but they were not strong and she was able to sleep through them.  Patient reports that upon waking she noticed the contractions were about every 5 minutes apart.  She does endorse a history of PTD at 33 weeks and was on Makena for her previous pregnancy, but none for this one.  Patient endorses fetal movement and denies vaginal concerns including leaking, discharge, and bleeding.  Patient states she was seen in the office last week and her cervix was closed.  She reports N/V, but states she just deals with it as the medication makes her tired.  She denies difficulty or pain with urination, but states she doesn't feel like her bladder empties all the way.  Patient states that she doesn't feel "like this is labor," but notes that the contractions are getting closer together.      OB History    Gravida  5   Para  3   Term  2   Preterm  1   AB  1   Living  3     SAB  1   TAB  0   Ectopic  0   Multiple  0   Live Births  3           Past Medical History:  Diagnosis Date  . Heart murmur   . History of broken collarbone    age 25  . Infection    UTI  . Kidney stones   . Ovarian cyst   . PCOS (polycystic ovarian syndrome)     Past Surgical History:  Procedure Laterality Date  . CHOLECYSTECTOMY    . OVARIAN CYST REMOVAL     3 separate surgeries     Family History  Problem Relation Age of Onset  . COPD Maternal Grandmother   . Cancer Paternal Grandmother   . Cancer Paternal Grandfather        lung    Social History   Tobacco Use  . Smoking status: Former Smoker    Types: Cigarettes  . Smokeless tobacco: Never Used  . Tobacco comment: quit with first preg  Substance Use Topics  . Alcohol use: Not Currently    Frequency: Never  . Drug use: Not Currently    Types: Marijuana    Comment: last in July    Allergies:  Allergies  Allergen Reactions  . Latex Hives  . Amoxicillin Nausea And Vomiting    Medications Prior to Admission  Medication Sig Dispense Refill Last Dose  . butalbital-acetaminophen-caffeine (FIORICET, ESGIC) 50-325-40 MG tablet Take 2 tablets by mouth 2 (two) times daily as needed for headache. (Patient not taking: Reported on 10/07/2018) 30 tablet 1   . cyclobenzaprine (FLEXERIL) 10 MG tablet Take 1 tablet (10 mg total) by mouth 3 (three) times daily as needed for muscle spasms (headache). (Patient not taking: Reported on 11/04/2018) 40 tablet 2   . metoCLOPramide (REGLAN) 10 MG tablet Take 1 tablet (10 mg total) by mouth 3 (three) times daily as needed for nausea (headache). (Patient not taking: Reported on 10/07/2018) 90 tablet 3   .  Prenatal Vit-Fe Fumarate-FA (PRENATAL VITAMINS PO) Take 1 tablet by mouth daily.     . traMADol (ULTRAM) 50 MG tablet Take 1 tablet (50 mg total) by mouth every 8 (eight) hours as needed. (Patient not taking: Reported on 07/04/2018) 8 tablet 0     Review of Systems  Constitutional: Negative for chills and fever.  Respiratory: Negative for cough and shortness of breath.   Gastrointestinal: Positive for abdominal pain (With contractions), nausea and vomiting. Negative for constipation and diarrhea.  Genitourinary: Negative for difficulty urinating, dysuria, vaginal bleeding and vaginal discharge.  Neurological: Negative for dizziness, light-headedness and headaches.   Physical Exam   Blood pressure 120/71, pulse 85, temperature 98.9 F (37.2 C), temperature source Oral, resp. rate 18, height 5\' 2"  (1.575 m), weight 69 kg, last menstrual period 03/09/2018, SpO2 99 %.  Physical Exam  Constitutional:  She is oriented to person, place, and time. She appears well-developed and well-nourished. No distress.  HENT:  Head: Normocephalic and atraumatic.  Eyes: Conjunctivae are normal.  Neck: Normal range of motion.  Cardiovascular: Normal rate.  Respiratory: Effort normal.  GI: Soft.  Genitourinary: Cervix exhibits no motion tenderness.    Vaginal discharge present.     No vaginal bleeding.  No bleeding in the vagina.    Genitourinary Comments: Sterile Speculum Exam: -Normal External Genitalia: Non tender, no apparent discharge at introitus.  -Vaginal Vault: Pink mucosa with good rugae. Vaginal wall prolapse.   Small amt thick white discharge-wet prep collected -Cervix:Difficult to visualize due to wall prolapse.  -Bimanual Exam: Dilation: Closed(ext ft) Effacement (%): Thick Cervical Position: Posterior Station: Ballotable Exam by:: Capri Raben CNM   Musculoskeletal: Normal range of motion.  Neurological: She is alert and oriented to person, place, and time.  Skin: Skin is warm and dry.  Psychiatric: She has a normal mood and affect. Her behavior is normal.    Fetal Assessment 145 bpm, Mod Var, -Decels, +Accels Toco: Irregular, 2-7 min  MAU Course   Results for orders placed or performed during the hospital encounter of 11/06/18 (from the past 24 hour(s))  Urinalysis, Routine w reflex microscopic     Status: Abnormal   Collection Time: 11/06/18 12:00 PM  Result Value Ref Range   Color, Urine YELLOW YELLOW   APPearance HAZY (A) CLEAR   Specific Gravity, Urine 1.013 1.005 - 1.030   pH 8.0 5.0 - 8.0   Glucose, UA NEGATIVE NEGATIVE mg/dL   Hgb urine dipstick NEGATIVE NEGATIVE   Bilirubin Urine NEGATIVE NEGATIVE   Ketones, ur 20 (A) NEGATIVE mg/dL   Protein, ur NEGATIVE NEGATIVE mg/dL   Nitrite NEGATIVE NEGATIVE   Leukocytes,Ua NEGATIVE NEGATIVE  Wet prep, genital     Status: Abnormal   Collection Time: 11/06/18 12:39 PM   Specimen: Vaginal  Result Value Ref Range   Yeast Wet Prep  HPF POC NONE SEEN NONE SEEN   Trich, Wet Prep NONE SEEN NONE SEEN   Clue Cells Wet Prep HPF POC NONE SEEN NONE SEEN   WBC, Wet Prep HPF POC MODERATE (A) NONE SEEN   Sperm NONE SEEN    No results found.  MDM PE Labs:UA, Wet Prep EFM Oral Fluids AntiEmetics Assessment and Plan  23 year old [id number]  SIUP at 34.4 weeks Cat I FT Contractions-Preterm  -Exam findings discussed. -Reassured that cervix remains closed. -Patient expresses concern about female providers and reassured that only female providers on staff today. -Will push oral fluids. -Wet prep collected and sent.  -Will consider tocolytics if  contractions continue. -NST reactive.  Maryann Conners MSN, CNM 11/06/2018, 12:29 PM   Reassessment (1:34 PM)  -Nurse reports patient given flexeril and crackers (per patient request). -Patient now with vomiting.  -Will give zofran 4mg  ODT and reassess.  -Patient reports improvement in contractions s/p one pitcher fluid. Informed will continue to monitor. -Will continue to push fluids and reassess.   Reassessment (3:00 PM)  -In room to reassess, patient reports that "I just want to leave because you think I am lying." -Patient further states that provider made comment about patient "fabricating contractions" and provider attempted to clarify that this was not what was said.   -Patient informed that provider able to see contractions via tocometry and that provider has seen no indications as to not believe patient. -Provider gave multiple apologies. -Nurse at bedside and informed that patient will be discharged. -Instructed to give precautions as appropriate.  -Discharged to home.  Maryann Conners MSN, CNM

## 2018-11-06 NOTE — MAU Note (Signed)
States contractions started last night, were every 8 min, was able to go to sleep.  This morning they started and were every 5 min, not able to sleep. No bleeding or leaking. Hx of PTD with 2nd child.

## 2018-11-06 NOTE — Discharge Instructions (Signed)
Preterm Labor and Birth Information ° °The normal length of a pregnancy is 39-41 weeks. Preterm labor is when labor starts before 37 completed weeks of pregnancy. °What are the risk factors for preterm labor? °Preterm labor is more likely to occur in women who: °· Have certain infections during pregnancy such as a bladder infection, sexually transmitted infection, or infection inside the uterus (chorioamnionitis). °· Have a shorter-than-normal cervix. °· Have gone into preterm labor before. °· Have had surgery on their cervix. °· Are younger than age 17 or older than age 35. °· Are African American. °· Are pregnant with twins or multiple babies (multiple gestation). °· Take street drugs or smoke while pregnant. °· Do not gain enough weight while pregnant. °· Became pregnant shortly after having been pregnant. °What are the symptoms of preterm labor? °Symptoms of preterm labor include: °· Cramps similar to those that can happen during a menstrual period. The cramps may happen with diarrhea. °· Pain in the abdomen or lower back. °· Regular uterine contractions that may feel like tightening of the abdomen. °· A feeling of increased pressure in the pelvis. °· Increased watery or bloody mucus discharge from the vagina. °· Water breaking (ruptured amniotic sac). °Why is it important to recognize signs of preterm labor? °It is important to recognize signs of preterm labor because babies who are born prematurely may not be fully developed. This can put them at an increased risk for: °· Long-term (chronic) heart and lung problems. °· Difficulty immediately after birth with regulating body systems, including blood sugar, body temperature, heart rate, and breathing rate. °· Bleeding in the brain. °· Cerebral palsy. °· Learning difficulties. °· Death. °These risks are highest for babies who are born before 34 weeks of pregnancy. °How is preterm labor treated? °Treatment depends on the length of your pregnancy, your condition,  and the health of your baby. It may involve: °· Having a stitch (suture) placed in your cervix to prevent your cervix from opening too early (cerclage). °· Taking or being given medicines, such as: °? Hormone medicines. These may be given early in pregnancy to help support the pregnancy. °? Medicine to stop contractions. °? Medicines to help mature the baby’s lungs. These may be prescribed if the risk of delivery is high. °? Medicines to prevent your baby from developing cerebral palsy. °If the labor happens before 34 weeks of pregnancy, you may need to stay in the hospital. °What should I do if I think I am in preterm labor? °If you think that you are going into preterm labor, call your health care provider right away. °How can I prevent preterm labor in future pregnancies? °To increase your chance of having a full-term pregnancy: °· Do not use any tobacco products, such as cigarettes, chewing tobacco, and e-cigarettes. If you need help quitting, ask your health care provider. °· Do not use street drugs or medicines that have not been prescribed to you during your pregnancy. °· Talk with your health care provider before taking any herbal supplements, even if you have been taking them regularly. °· Make sure you gain a healthy amount of weight during your pregnancy. °· Watch for infection. If you think that you might have an infection, get it checked right away. °· Make sure to tell your health care provider if you have gone into preterm labor before. °This information is not intended to replace advice given to you by your health care provider. Make sure you discuss any questions you have with your   health care provider. °Document Released: 05/26/2003 Document Revised: 06/27/2018 Document Reviewed: 07/27/2015 °Elsevier Patient Education © 2020 [employer] Inc[employer] ° °

## 2018-11-11 ENCOUNTER — Other Ambulatory Visit: Payer: Self-pay

## 2018-11-11 ENCOUNTER — Encounter (HOSPITAL_COMMUNITY): Payer: Self-pay

## 2018-11-11 ENCOUNTER — Inpatient Hospital Stay (HOSPITAL_COMMUNITY)
Admission: AD | Admit: 2018-11-11 | Discharge: 2018-11-12 | Disposition: A | Discharge status: Home / Self Care | Payer: Medicaid Other | Attending: Family Medicine | Admitting: Family Medicine

## 2018-11-11 DIAGNOSIS — Z809 Family history of malignant neoplasm, unspecified: Secondary | ICD-10-CM | POA: Insufficient documentation

## 2018-11-11 DIAGNOSIS — Z91040 Latex allergy status: Secondary | ICD-10-CM | POA: Diagnosis not present

## 2018-11-11 DIAGNOSIS — Z880 Allergy status to penicillin: Secondary | ICD-10-CM | POA: Diagnosis not present

## 2018-11-11 DIAGNOSIS — Z801 Family history of malignant neoplasm of trachea, bronchus and lung: Secondary | ICD-10-CM | POA: Diagnosis not present

## 2018-11-11 DIAGNOSIS — Z9049 Acquired absence of other specified parts of digestive tract: Secondary | ICD-10-CM | POA: Insufficient documentation

## 2018-11-11 DIAGNOSIS — Z87891 Personal history of nicotine dependence: Secondary | ICD-10-CM | POA: Insufficient documentation

## 2018-11-11 DIAGNOSIS — O4703 False labor before 37 completed weeks of gestation, third trimester: Secondary | ICD-10-CM | POA: Insufficient documentation

## 2018-11-11 DIAGNOSIS — Z3480 Encounter for supervision of other normal pregnancy, unspecified trimester: Secondary | ICD-10-CM

## 2018-11-11 DIAGNOSIS — Z3A35 35 weeks gestation of pregnancy: Secondary | ICD-10-CM | POA: Insufficient documentation

## 2018-11-11 LAB — URINALYSIS, ROUTINE W REFLEX MICROSCOPIC
Bilirubin Urine: NEGATIVE
Glucose, UA: 50 mg/dL — AB
Hgb urine dipstick: NEGATIVE
Ketones, ur: 5 mg/dL — AB
Leukocytes,Ua: NEGATIVE
Nitrite: NEGATIVE
Protein, ur: 30 mg/dL — AB
Specific Gravity, Urine: 1.028 (ref 1.005–1.030)
pH: 5 (ref 5.0–8.0)

## 2018-11-11 MED ORDER — NIFEDIPINE 10 MG PO CAPS
10.0000 mg | ORAL_CAPSULE | ORAL | Status: AC | PRN
  Administered 2018-11-11 – 2018-11-12 (×4): 10 mg via ORAL
  Filled 2018-11-11 (×4): qty 1

## 2018-11-11 NOTE — MAU Provider Note (Signed)
Chief Complaint:  Abdominal Pain and Contractions   First Provider Initiated Contact with Patient 11/11/18 2311      HPI: Madison Stone is a 23 y.o. [id number] at 26w2dwho presents to maternity admissions reporting contractions and loss of mucous plug.  . She reports good fetal movement, denies LOF, vaginal bleeding, vaginal itching/burning, urinary symptoms, h/a, dizziness, n/v, diarrhea, constipation or fever/chills.  She denies headache, visual changes or RUQ abdominal pain.  RN note: PT SAYS SHE HAS BEEN HAVING UC'S  SINCE LAST WEEK. LOST MUCUS PLUG AT 430PM.   UC WORSE AT 730. [place] WITH  DR Nehemiah Settle.  DENIES HSV AND MRSA.  LAST SEX- SUNDAY  Past Medical History: Past Medical History:  Diagnosis Date  . Heart murmur   . History of broken collarbone    age 41  . Infection    UTI  . Kidney stones   . Ovarian cyst   . PCOS (polycystic ovarian syndrome)     Past obstetric history: OB History  Gravida Para Term Preterm AB Living  5 3 2 1 1 3   SAB TAB Ectopic Multiple Live Births  1 0 0 0 3    # Outcome Date GA Lbr Len/2nd Weight Sex Delivery Anes PTL Lv  5 Current           4 Term 08/23/16 [redacted]w[redacted]d   M Vag-Spont   LIV  3 Preterm 09/17/14 [redacted]w[redacted]d   F Vag-Spont  Y LIV  2 Term 04/15/13 [redacted]w[redacted]d   F Vag-Spont   LIV  1 SAB 2013            Past Surgical History: Past Surgical History:  Procedure Laterality Date  . CHOLECYSTECTOMY    . OVARIAN CYST REMOVAL     3 separate surgeries     Family History: Family History  Problem Relation Age of Onset  . COPD Maternal Grandmother   . Cancer Paternal Grandmother   . Cancer Paternal Grandfather        lung    Social History: Social History   Tobacco Use  . Smoking status: Former Smoker    Types: Cigarettes  . Smokeless tobacco: Never Used  . Tobacco comment: quit with first preg  Substance Use Topics  . Alcohol use: Not Currently    Frequency: Never  . Drug use: Not Currently    Types: Marijuana    Comment: last in July     Allergies:  Allergies  Allergen Reactions  . Latex Hives  . Amoxicillin Nausea And Vomiting    Meds:  Medications Prior to Admission  Medication Sig Dispense Refill Last Dose  . Prenatal Vit-Fe Fumarate-FA (PRENATAL VITAMINS PO) Take 1 tablet by mouth daily.   11/10/2018 at Unknown time    I have reviewed patient's Past Medical Hx, Surgical Hx, Family Hx, Social Hx, medications and allergies.   ROS:  Review of Systems  Constitutional: Negative for chills and fever.  Respiratory: Negative for shortness of breath.   Gastrointestinal: Positive for abdominal pain. Negative for constipation, diarrhea, nausea and vomiting.  Genitourinary: Positive for pelvic pain. Negative for vaginal bleeding and vaginal discharge.  Musculoskeletal: Negative for back pain.   Other systems negative  Physical Exam   Patient Vitals for the past 24 hrs:  BP Temp Temp src Pulse Resp Height Weight  11/11/18 2209 113/65 98.7 F (37.1 C) Oral 93 20 5\' 2"  (1.575 m) 69.4 kg   Constitutional: Well-developed, well-nourished female in no acute distress.  Cardiovascular: normal rate and rhythm  Respiratory: normal effort, clear to auscultation bilaterally GI: Abd soft, non-tender, gravid appropriate for gestational age.   No rebound or guarding. MS: Extremities nontender, no edema, normal ROM Neurologic: Alert and oriented x 4.  GU: Neg CVAT.  PELVIC EXAM:     Cervix 1cm ext os, FT internal os/long/-3/vtx   FHT:  Baseline 140 , moderate variability, accelerations present, no decelerations Contractions: Irregular     Labs: Results for orders placed or performed during the hospital encounter of 11/11/18 (from the past 24 hour(s))  Urinalysis, Routine w reflex microscopic     Status: Abnormal   Collection Time: 11/11/18 10:18 PM  Result Value Ref Range   Color, Urine YELLOW YELLOW   APPearance HAZY (A) CLEAR   Specific Gravity, Urine 1.028 1.005 - 1.030   pH 5.0 5.0 - 8.0   Glucose, UA 50 (A)  NEGATIVE mg/dL   Hgb urine dipstick NEGATIVE NEGATIVE   Bilirubin Urine NEGATIVE NEGATIVE   Ketones, ur 5 (A) NEGATIVE mg/dL   Protein, ur 30 (A) NEGATIVE mg/dL   Nitrite NEGATIVE NEGATIVE   Leukocytes,Ua NEGATIVE NEGATIVE   RBC / HPF 0-5 0 - 5 RBC/hpf   WBC, UA 0-5 0 - 5 WBC/hpf   Bacteria, UA RARE (A) NONE SEEN   Squamous Epithelial / LPF 0-5 0 - 5   Mucus PRESENT    Ca Oxalate Crys, UA PRESENT    O/Positive/-- (04/29 1542)  Imaging:  No results found.  MAU Course/MDM: I have ordered labs and reviewed results.  UA normal  NST reviewed, reactive with irregular contractions Procardia given (for comfort) with good resolution of contractions    Assessment: Single intrauterine pregnancy at 6757w3d Preterm contractions, not in labor  Plan: Discharge home Preterm Labor precautions and fetal kick counts PO hydration, rest as needed Follow up in Office for prenatal visits and recheck  Encouraged to return here or to other Urgent Care/ED if she develops worsening of symptoms, increase in pain, fever, or other concerning symptoms.   Pt stable at time of discharge.  Wynelle BourgeoisMarie Nazir Hacker CNM, MSN Certified Nurse-Midwife 11/11/2018 11:12 PM

## 2018-11-11 NOTE — MAU Note (Signed)
PT SAYS SHE HAS BEEN HAVING UC'S  SINCE LAST WEEK. LOST MUCUS PLUG AT 430PM.   UC WORSE AT 730. [place] WITH  DR Nehemiah Settle.  DENIES HSV AND MRSA.  LAST SEX- SUNDAY

## 2018-11-12 NOTE — Discharge Instructions (Signed)
Preterm Labor and Birth Information °Pregnancy normally lasts 39-41 weeks. Preterm labor is when labor starts early. It starts before you have been pregnant for 37 whole weeks. °What are the risk factors for preterm labor? °Preterm labor is more likely to occur in women who: °· Have an infection while pregnant. °· Have a cervix that is short. °· Have gone into preterm labor before. °· Have had surgery on their cervix. °· Are younger than age 24. °· Are older than age 35. °· Are African American. °· Are pregnant with two or more babies. °· Take street drugs while pregnant. °· Smoke while pregnant. °· Do not gain enough weight while pregnant. °· Got pregnant right after another pregnancy. °What are the symptoms of preterm labor? °Symptoms of preterm labor include: °· Cramps. The cramps may feel like the cramps some women get during their period. The cramps may happen with watery poop (diarrhea). °· Pain in the belly (abdomen). °· Pain in the lower back. °· Regular contractions or tightening. It may feel like your belly is getting tighter. °· Pressure in the lower belly that seems to get stronger. °· More fluid (discharge) leaking from the vagina. The fluid may be watery or bloody. °· Water breaking. °Why is it important to notice signs of preterm labor? °Babies who are born early may not be fully developed. They have a higher chance for: °· Long-term heart problems. °· Long-term lung problems. °· Trouble controlling body systems, like breathing. °· Bleeding in the brain. °· A condition called cerebral palsy. °· Learning difficulties. °· Death. °These risks are highest for babies who are born before 34 weeks of pregnancy. °How is preterm labor treated? °Treatment depends on: °· How long you were pregnant. °· Your condition. °· The health of your baby. °Treatment may involve: °· Having a stitch (suture) placed in your cervix. When you give birth, your cervix opens so the baby can come out. The stitch keeps the cervix  from opening too soon. °· Staying at the hospital. °· Taking or getting medicines, such as: °? Hormone medicines. °? Medicines to stop contractions. °? Medicines to help the baby’s lungs develop. °? Medicines to prevent your baby from having cerebral palsy. °What should I do if I am in preterm labor? °If you think you are going into labor too soon, call your doctor right away. °How can I prevent preterm labor? °· Do not use any tobacco products. °? Examples of these are cigarettes, chewing tobacco, and e-cigarettes. °? If you need help quitting, ask your doctor. °· Do not use street drugs. °· Do not use any medicines unless you ask your doctor if they are safe for you. °· Talk with your doctor before taking any herbal supplements. °· Make sure you gain enough weight. °· Watch for infection. If you think you might have an infection, get it checked right away. °· If you have gone into preterm labor before, tell your doctor. °This information is not intended to replace advice given to you by your health care provider. Make sure you discuss any questions you have with your health care provider. °Document Released: 06/01/2008 Document Revised: 06/27/2018 Document Reviewed: 07/27/2015 °Elsevier Patient Education © 2020 [employer] Inc. ° °

## 2018-11-15 ENCOUNTER — Encounter (HOSPITAL_COMMUNITY): Payer: Self-pay | Admitting: *Deleted

## 2018-11-15 ENCOUNTER — Inpatient Hospital Stay (HOSPITAL_COMMUNITY)
Admission: AD | Admit: 2018-11-15 | Discharge: 2018-11-15 | Disposition: A | Discharge status: Home / Self Care | Payer: Medicaid Other | Attending: Obstetrics and Gynecology | Admitting: Obstetrics and Gynecology

## 2018-11-15 ENCOUNTER — Other Ambulatory Visit: Payer: Self-pay

## 2018-11-15 DIAGNOSIS — O4703 False labor before 37 completed weeks of gestation, third trimester: Secondary | ICD-10-CM | POA: Insufficient documentation

## 2018-11-15 DIAGNOSIS — Z3A35 35 weeks gestation of pregnancy: Secondary | ICD-10-CM | POA: Diagnosis not present

## 2018-11-15 DIAGNOSIS — O479 False labor, unspecified: Secondary | ICD-10-CM

## 2018-11-15 DIAGNOSIS — Z880 Allergy status to penicillin: Secondary | ICD-10-CM | POA: Insufficient documentation

## 2018-11-15 DIAGNOSIS — Z87891 Personal history of nicotine dependence: Secondary | ICD-10-CM | POA: Insufficient documentation

## 2018-11-15 DIAGNOSIS — Z3A36 36 weeks gestation of pregnancy: Secondary | ICD-10-CM

## 2018-11-15 DIAGNOSIS — Z91040 Latex allergy status: Secondary | ICD-10-CM | POA: Insufficient documentation

## 2018-11-15 DIAGNOSIS — Z3689 Encounter for other specified antenatal screening: Secondary | ICD-10-CM | POA: Diagnosis not present

## 2018-11-15 DIAGNOSIS — Z87440 Personal history of urinary (tract) infections: Secondary | ICD-10-CM | POA: Insufficient documentation

## 2018-11-15 DIAGNOSIS — Z87442 Personal history of urinary calculi: Secondary | ICD-10-CM | POA: Insufficient documentation

## 2018-11-15 DIAGNOSIS — R1030 Lower abdominal pain, unspecified: Secondary | ICD-10-CM | POA: Diagnosis present

## 2018-11-15 DIAGNOSIS — O99323 Drug use complicating pregnancy, third trimester: Secondary | ICD-10-CM

## 2018-11-15 LAB — RAPID URINE DRUG SCREEN, HOSP PERFORMED
Amphetamines: NOT DETECTED
Barbiturates: NOT DETECTED
Benzodiazepines: NOT DETECTED
Cocaine: NOT DETECTED
Opiates: NOT DETECTED
Tetrahydrocannabinol: POSITIVE — AB

## 2018-11-15 LAB — URINALYSIS, ROUTINE W REFLEX MICROSCOPIC
Bilirubin Urine: NEGATIVE
Glucose, UA: 50 mg/dL — AB
Hgb urine dipstick: NEGATIVE
Ketones, ur: 20 mg/dL — AB
Leukocytes,Ua: NEGATIVE
Nitrite: NEGATIVE
Protein, ur: NEGATIVE mg/dL
Specific Gravity, Urine: 1.017 (ref 1.005–1.030)
pH: 7 (ref 5.0–8.0)

## 2018-11-15 MED ORDER — ZOLPIDEM TARTRATE 5 MG PO TABS: 5.0000 mg | ORAL_TABLET | Freq: Every evening | ORAL | 0 refills | Status: DC | PRN

## 2018-11-15 MED ORDER — CYCLOBENZAPRINE HCL 10 MG PO TABS
10.0000 mg | ORAL_TABLET | Freq: Once | ORAL | Status: AC
  Administered 2018-11-15: 10 mg via ORAL
  Filled 2018-11-15: qty 1

## 2018-11-15 MED ORDER — LACTATED RINGERS IV BOLUS
1000.0000 mL | Freq: Once | INTRAVENOUS | Status: AC
  Administered 2018-11-15: 19:00:00 1000 mL via INTRAVENOUS

## 2018-11-15 MED ORDER — PROMETHAZINE HCL 25 MG/ML IJ SOLN
25.0000 mg | Freq: Four times a day (QID) | INTRAMUSCULAR | Status: DC | PRN
  Administered 2018-11-15: 25 mg via INTRAVENOUS
  Filled 2018-11-15: qty 1

## 2018-11-15 NOTE — MAU Note (Signed)
Madison Stone is a 23 y.o. at [redacted]w[redacted]d here in MAU reporting: contractions for the past 1-2 hours. They come every 3 min. No bleeding or LOF. Has felt some FM but less than normal.  Onset of complaint: the past 1-2 hours  Pain score: 9/10  Vitals:   11/15/18 1800  BP: 121/69  Pulse: 89  Resp: 20  Temp: 98.8 F (37.1 C)  SpO2: 100%     FHT:128  Lab orders placed from triage: UA, unable to give sample at this time

## 2018-11-15 NOTE — Discharge Instructions (Signed)

## 2018-11-15 NOTE — MAU Provider Note (Signed)
History     CSN: [id number]  Arrival date and time: 11/15/18 1735   First Provider Initiated Contact with Patient 11/15/18 1939     Chief Complaint  Patient presents with  . Contractions   HPI Madison Stone is a 23 y.o. [phone] who presents to MAU with chief complaint of lower abdominal contractions. She denies vaginal bleeding, leaking of fluid, decreased fetal movement, fever or recent illness.  This is a recurring problem for which patient has been evaluated in MAU on multiple occasions. She rates her pain as 9/10 upon arrival in MAU. She was advised to try Tylenol PM for pain management and to help her sleep but has not experienced relief. She has not taken medication or tried other treatments for this complaint.  Patient's history includes delivery at 32 weeks. She declined 17-P injections this pregnancy.  OB History    Gravida  5   Para  3   Term  2   Preterm  1   AB  1   Living  3     SAB  1   TAB  0   Ectopic  0   Multiple  0   Live Births  3           Past Medical History:  Diagnosis Date  . Heart murmur   . History of broken collarbone    age 89  . Infection    UTI  . Kidney stones   . Ovarian cyst   . PCOS (polycystic ovarian syndrome)     Past Surgical History:  Procedure Laterality Date  . CHOLECYSTECTOMY    . OVARIAN CYST REMOVAL     3 separate surgeries     Family History  Problem Relation Age of Onset  . COPD Maternal Grandmother   . Cancer Paternal Grandmother   . Cancer Paternal Grandfather        lung    Social History   Tobacco Use  . Smoking status: Former Smoker    Types: Cigarettes  . Smokeless tobacco: Never Used  . Tobacco comment: quit with first preg  Substance Use Topics  . Alcohol use: Not Currently    Frequency: Never  . Drug use: Not Currently    Types: Marijuana    Comment: last in July    Allergies:  Allergies  Allergen Reactions  . Latex Hives  . Amoxicillin Nausea And Vomiting     Medications Prior to Admission  Medication Sig Dispense Refill Last Dose  . Prenatal Vit-Fe Fumarate-FA (PRENATAL VITAMINS PO) Take 1 tablet by mouth daily.       Review of Systems  Constitutional: Positive for fatigue. Negative for chills and fever.  Respiratory: Negative for shortness of breath.   Gastrointestinal: Positive for abdominal pain.  Genitourinary: Negative for difficulty urinating, dysuria, frequency, vaginal bleeding, vaginal discharge and vaginal pain.  Musculoskeletal: Negative for back pain.  All other systems reviewed and are negative.  Physical Exam   Blood pressure 121/69, pulse 89, temperature 98.8 F (37.1 C), temperature source Oral, resp. rate 20, last menstrual period 03/09/2018, SpO2 100 %.  Physical Exam  Nursing note and vitals reviewed. Constitutional: She is oriented to person, place, and time. She appears well-developed and well-nourished.  Cardiovascular: Normal rate.  Respiratory: Effort normal. No respiratory distress.  GI: She exhibits no distension. There is no abdominal tenderness. There is no rebound and no guarding.  Neurological: She is alert and oriented to person, place, and time.  Skin: Skin is  warm and dry.  Psychiatric: She has a normal mood and affect. Her behavior is normal. Thought content normal.    MAU Course  Procedures  --Category I tracing: baseline 140, moderate variability, positive accels, no decels --Toco: irregular ctx q 4-8 min, resolved with IV fluid bolus --Patient sleeping at 1930 after Phenergan and Flexeril. Verbalizes to CNM she can still feel her contractions in her sleep. --Patient declined offer of additional hour of monitoring in MAU prior to discharge  Patient Vitals for the past 24 hrs:  BP Temp Temp src Pulse Resp SpO2  11/15/18 2006 (!) 112/55 98.5 F (36.9 C) Oral 83 14 98 %  11/15/18 1800 121/69 98.8 F (37.1 C) Oral 89 20 100 %   Results for orders placed or performed during the hospital  encounter of 11/15/18 (from the past 24 hour(s))  Urinalysis, Routine w reflex microscopic     Status: Abnormal   Collection Time: 11/15/18  6:11 PM  Result Value Ref Range   Color, Urine YELLOW YELLOW   APPearance HAZY (A) CLEAR   Specific Gravity, Urine 1.017 1.005 - 1.030   pH 7.0 5.0 - 8.0   Glucose, UA 50 (A) NEGATIVE mg/dL   Hgb urine dipstick NEGATIVE NEGATIVE   Bilirubin Urine NEGATIVE NEGATIVE   Ketones, ur 20 (A) NEGATIVE mg/dL   Protein, ur NEGATIVE NEGATIVE mg/dL   Nitrite NEGATIVE NEGATIVE   Leukocytes,Ua NEGATIVE NEGATIVE  Urine rapid drug screen (hosp performed)     Status: Abnormal   Collection Time: 11/15/18  6:11 PM  Result Value Ref Range   Opiates NONE DETECTED NONE DETECTED   Cocaine NONE DETECTED NONE DETECTED   Benzodiazepines NONE DETECTED NONE DETECTED   Amphetamines NONE DETECTED NONE DETECTED   Tetrahydrocannabinol POSITIVE (A) NONE DETECTED   Barbiturates NONE DETECTED NONE DETECTED   Meds ordered this encounter  Medications  . promethazine (PHENERGAN) injection 25 mg  . cyclobenzaprine (FLEXERIL) tablet 10 mg  . lactated ringers bolus 1,000 mL  . zolpidem (AMBIEN) 5 MG tablet    Sig: Take 1 tablet (5 mg total) by mouth at bedtime as needed for sleep.    Dispense:  3 tablet    Refill:  0    Order Specific Question:   Supervising Provider    Answer:   CONSTANT, PEGGY [[id number]]   Assessment and Plan  --23 y.o. [id number] at [redacted]w[redacted]d  --Reactive tracing --Cervix remains 1-1.5cm/thick/posterior --Short course rx Ambien x 3 pills --THC + in MAU, problem list updated --Discharge home in stable condition with labor precautions  F/U: Next OB appt [facility] 11/18/18  Darlina Rumpf, CNM 11/15/2018, 8:37 PM

## 2018-11-17 ENCOUNTER — Encounter: Payer: Self-pay | Admitting: Advanced Practice Midwife

## 2018-11-18 ENCOUNTER — Other Ambulatory Visit: Payer: Self-pay

## 2018-11-18 ENCOUNTER — Other Ambulatory Visit (HOSPITAL_COMMUNITY)
Admission: RE | Admit: 2018-11-18 | Discharge: 2018-11-18 | Disposition: A | Discharge status: Home / Self Care | Payer: Medicaid Other | Source: Ambulatory Visit | Attending: Obstetrics & Gynecology | Admitting: Obstetrics & Gynecology

## 2018-11-18 ENCOUNTER — Ambulatory Visit (INDEPENDENT_AMBULATORY_CARE_PROVIDER_SITE_OTHER): Payer: Medicaid Other | Admitting: Obstetrics and Gynecology

## 2018-11-18 VITALS — BP 116/77 | HR 80 | Wt 153.0 lb

## 2018-11-18 DIAGNOSIS — Z3A36 36 weeks gestation of pregnancy: Secondary | ICD-10-CM

## 2018-11-18 DIAGNOSIS — Z3483 Encounter for supervision of other normal pregnancy, third trimester: Secondary | ICD-10-CM

## 2018-11-18 DIAGNOSIS — Z3480 Encounter for supervision of other normal pregnancy, unspecified trimester: Secondary | ICD-10-CM | POA: Insufficient documentation

## 2018-11-18 MED ORDER — ZOLPIDEM TARTRATE 5 MG PO TABS: 5.0000 mg | ORAL_TABLET | Freq: Every evening | ORAL | 0 refills | Status: DC | PRN

## 2018-11-18 NOTE — Patient Instructions (Addendum)

## 2018-11-18 NOTE — Progress Notes (Signed)
   PRENATAL VISIT NOTE  Subjective:  Madison Stone is a 23 y.o. [phone] at [redacted]w[redacted]d being seen today for ongoing prenatal care.  She is currently monitored for the following issues for this high-risk pregnancy and has Migraine with aura and without status migrainosus, not intractable; Pregnancy headache in second trimester; Supervision of other normal pregnancy, antepartum; Low birth weight or preterm infant, 1750-1999 grams; History of preterm delivery; and Substance abuse affecting pregnancy in third trimester, antepartum on their problem list.  Patient reports occasional contractions.  Contractions: Irritability. Vag. Bleeding: Scant.  Movement: Present. Denies leaking of fluid.   The following portions of the patient's history were reviewed and updated as appropriate: allergies, current medications, past family history, past medical history, past social history, past surgical history and problem list.   Objective:   Vitals:   11/18/18 0947  BP: 116/77  Pulse: 80  Weight: 153 lb (69.4 kg)    Fetal Status:   Fundal Height: 36 cm Movement: Present  Presentation: Vertex  General:  Alert, oriented and cooperative. Patient is in no acute distress.  Skin: Skin is warm and dry. No rash noted.   Cardiovascular: Normal heart rate noted  Respiratory: Normal respiratory effort, no problems with respiration noted  Abdomen: Soft, gravid, appropriate for gestational age.  Pain/Pressure: Present     Pelvic: Cervical exam performed Dilation: 1.5 Effacement (%): Thick Station: Ballotable  Extremities: Normal range of motion.  Edema: Trace  Mental Status: Normal mood and affect. Normal behavior. Normal judgment and thought content.   Assessment and Plan:  Pregnancy: [id number] at 108w2d 1. Supervision of other normal pregnancy, antepartum  - GC/Chlamydia probe amp ([place])not at [facility] - Culture, beta strep (group b only)  Preterm labor symptoms and general obstetric precautions including but  not limited to vaginal bleeding, contractions, leaking of fluid and fetal movement were reviewed in detail with the patient. Please refer to After Visit Summary for other counseling recommendations.   Return in about 1 week (around 11/25/2018), or In person per patient requests.  Future Appointments  Date Time Provider [place]  11/25/2018  8:15 AM Seabron Spates, CNM [facility] None    Noni Saupe, NP

## 2018-11-19 ENCOUNTER — Encounter: Payer: Medicaid Other | Admitting: Obstetrics & Gynecology

## 2018-11-19 LAB — GC/CHLAMYDIA PROBE AMP (~~LOC~~) NOT AT ARMC
Chlamydia: NEGATIVE
Neisseria Gonorrhea: NEGATIVE

## 2018-11-22 LAB — CULTURE, BETA STREP (GROUP B ONLY): Strep Gp B Culture: NEGATIVE

## 2018-11-25 ENCOUNTER — Ambulatory Visit (INDEPENDENT_AMBULATORY_CARE_PROVIDER_SITE_OTHER): Payer: Medicaid Other | Admitting: Advanced Practice Midwife

## 2018-11-25 ENCOUNTER — Other Ambulatory Visit: Payer: Self-pay

## 2018-11-25 ENCOUNTER — Encounter: Payer: Self-pay | Admitting: Advanced Practice Midwife

## 2018-11-25 VITALS — BP 108/63 | HR 96 | Wt 150.0 lb

## 2018-11-25 DIAGNOSIS — Z3483 Encounter for supervision of other normal pregnancy, third trimester: Secondary | ICD-10-CM

## 2018-11-25 DIAGNOSIS — Z3480 Encounter for supervision of other normal pregnancy, unspecified trimester: Secondary | ICD-10-CM

## 2018-11-25 DIAGNOSIS — Z3A37 37 weeks gestation of pregnancy: Secondary | ICD-10-CM

## 2018-11-25 NOTE — Progress Notes (Deleted)
  Subjective:    Madison Stone is being seen today for her first obstetrical visit.  This {is/is not:9024} a planned pregnancy. She is at [redacted]w[redacted]d gestation. Her obstetrical history is significant for {ob risk factors:10154}. Relationship with FOB: {fob:16621}. Patient {does/does not:19097} intend to breast feed. Pregnancy history fully reviewed.  Patient reports {sx:14538}.  Review of Systems:   Review of Systems  Objective:     Wt 68 kg   LMP 03/09/2018 (Exact Date)   BMI 27.44 kg/m  Physical Exam  Exam    Assessment:    Pregnancy: [id number] Patient Active Problem List   Diagnosis Date Noted  . Substance abuse affecting pregnancy in third trimester, antepartum 11/15/2018  . History of preterm delivery 10/07/2018  . Supervision of other normal pregnancy, antepartum 07/16/2018  . Low birth weight or preterm infant, 1750-1999 grams 07/16/2018  . Migraine with aura and without status migrainosus, not intractable 07/04/2018  . Pregnancy headache in second trimester 07/04/2018       Plan:     Initial labs drawn. Prenatal vitamins. Problem list reviewed and updated. AFP3 discussed: {requests/ordered/declines:14581}. Role of ultrasound in pregnancy discussed; fetal survey: {requests/ordered/declines:14581}. Amniocentesis discussed: {amniocentesis:14582}. Follow up in {numbers 0-4:31231} weeks. ***% of *** min visit spent on counseling and coordination of care.  ***   Hansel Feinstein 11/25/2018

## 2018-11-25 NOTE — Progress Notes (Signed)
   PRENATAL VISIT NOTE  Subjective:  Madison Stone is a 23 y.o. [phone] at [redacted]w[redacted]d being seen today for ongoing prenatal care.  She is currently monitored for the following issues for this low-risk pregnancy and has Migraine with aura and without status migrainosus, not intractable; Pregnancy headache in second trimester; Supervision of other normal pregnancy, antepartum; Low birth weight or preterm infant, 1750-1999 grams; History of preterm delivery; and Substance abuse affecting pregnancy in third trimester, antepartum on their problem list.  Patient reports occasional contractions.  Contractions: Irregular. Vag. Bleeding: None.  Movement: Present. Denies leaking of fluid.   The following portions of the patient's history were reviewed and updated as appropriate: allergies, current medications, past family history, past medical history, past social history, past surgical history and problem list.   Objective:   Vitals:   11/25/18 0820  BP: 108/63  Pulse: 96  Weight: 68 kg    Fetal Status:     Movement: Present     General:  Alert, oriented and cooperative. Patient is in no acute distress.  Skin: Skin is warm and dry. No rash noted.   Cardiovascular: Normal heart rate noted  Respiratory: Normal respiratory effort, no problems with respiration noted  Abdomen: Soft, gravid, appropriate for gestational age.  Pain/Pressure: Present     Pelvic: Cervical exam performed       Cervix 2/50/-3/vertex  Extremities: Normal range of motion.  Edema: Trace  Mental Status: Normal mood and affect. Normal behavior. Normal judgment and thought content.   Assessment and Plan:  Pregnancy: A6T0160 at [redacted]w[redacted]d Anxious to deliver, wants to be induced Reviewed we cannot induce this early, no indication Reviewed signs of labor  Term labor symptoms and general obstetric precautions including but not limited to vaginal bleeding, contractions, leaking of fluid and fetal movement were reviewed in detail with  the patient. Please refer to After Visit Summary for other counseling recommendations.   Return in about 1 week (around 12/02/2018) for [facility].   Hansel Feinstein, CNM

## 2018-11-25 NOTE — Patient Instructions (Signed)

## 2018-11-25 NOTE — Progress Notes (Deleted)
  Subjective:    Madison Stone is being seen today for her first obstetrical visit.  This {is/is not:9024} a planned pregnancy. She is at [redacted]w[redacted]d gestation. Her obstetrical history is significant for {ob risk factors:10154}. Relationship with FOB: {fob:16621}. Patient {does/does not:19097} intend to breast feed. Pregnancy history fully reviewed.  Patient reports {sx:14538}.  Review of Systems:   Review of Systems  Objective:     BP 108/63   Pulse 96   Wt 68 kg   LMP 03/09/2018 (Exact Date)   BMI 27.44 kg/m  Physical Exam  Exam    Assessment:    Pregnancy: [id number] Patient Active Problem List   Diagnosis Date Noted  . Substance abuse affecting pregnancy in third trimester, antepartum 11/15/2018  . History of preterm delivery 10/07/2018  . Supervision of other normal pregnancy, antepartum 07/16/2018  . Low birth weight or preterm infant, 1750-1999 grams 07/16/2018  . Migraine with aura and without status migrainosus, not intractable 07/04/2018  . Pregnancy headache in second trimester 07/04/2018       Plan:     Initial labs drawn. Prenatal vitamins. Problem list reviewed and updated. AFP3 discussed: {requests/ordered/declines:14581}. Role of ultrasound in pregnancy discussed; fetal survey: {requests/ordered/declines:14581}. Amniocentesis discussed: {amniocentesis:14582}. Follow up in {numbers 0-4:31231} weeks. ***% of *** min visit spent on counseling and coordination of care.  ***   Hansel Feinstein 11/25/2018

## 2018-11-26 ENCOUNTER — Encounter: Payer: Self-pay | Admitting: Advanced Practice Midwife

## 2018-11-28 ENCOUNTER — Other Ambulatory Visit: Payer: Self-pay | Admitting: Advanced Practice Midwife

## 2018-11-28 ENCOUNTER — Inpatient Hospital Stay (HOSPITAL_COMMUNITY)
Admission: AD | Admit: 2018-11-28 | Discharge: 2018-11-28 | Disposition: A | Discharge status: Home / Self Care | Payer: Medicaid Other | Attending: Obstetrics and Gynecology | Admitting: Obstetrics and Gynecology

## 2018-11-28 ENCOUNTER — Encounter (HOSPITAL_COMMUNITY): Payer: Self-pay

## 2018-11-28 ENCOUNTER — Other Ambulatory Visit: Payer: Self-pay

## 2018-11-28 DIAGNOSIS — O471 False labor at or after 37 completed weeks of gestation: Secondary | ICD-10-CM | POA: Insufficient documentation

## 2018-11-28 DIAGNOSIS — Z3A38 38 weeks gestation of pregnancy: Secondary | ICD-10-CM | POA: Insufficient documentation

## 2018-11-28 DIAGNOSIS — O479 False labor, unspecified: Secondary | ICD-10-CM

## 2018-11-28 MED ORDER — CYCLOBENZAPRINE HCL 10 MG PO TABS
10.0000 mg | ORAL_TABLET | ORAL | Status: AC
  Administered 2018-11-28: 10 mg via ORAL
  Filled 2018-11-28: qty 1

## 2018-11-28 MED ORDER — ZOLPIDEM TARTRATE 5 MG PO TABS: 5.0000 mg | ORAL_TABLET | Freq: Every evening | ORAL | 0 refills | Status: DC | PRN

## 2018-11-28 NOTE — Progress Notes (Signed)
Refilled #5tabs of Ambien per request

## 2018-11-28 NOTE — MAU Note (Signed)
Contractions, got regular 2 hrs ago, now every 4 min.  Started feeling weird this morning, heartburn and nausea. No bleeding or water leaking. 2 cm when last checked.

## 2018-11-28 NOTE — MAU Note (Signed)
I have communicated with Dr Dione Plover and reviewed vital signs:  Vitals:   11/28/18 1336 11/28/18 1559  BP: 120/67 120/72  Pulse: 83   Resp:    Temp:    SpO2:      Vaginal exam:  Dilation: 3 Effacement (%): 50 Cervical Position: Posterior Station: -3 Presentation: Vertex Exam by:: TLytle RN ,   Also reviewed contraction pattern and that non-stress test is reactive.  It has been documented that patient is contracting irregularly with minimal cervical change over 2 hours not indicating active labor.  Patient denies any other complaints.  Based on this report provider has given order for discharge.  A discharge order and diagnosis entered by a provider.   Labor discharge instructions reviewed with patient.

## 2018-11-28 NOTE — Discharge Instructions (Signed)

## 2018-12-02 ENCOUNTER — Other Ambulatory Visit: Payer: Self-pay

## 2018-12-02 ENCOUNTER — Encounter: Payer: Self-pay | Admitting: Advanced Practice Midwife

## 2018-12-02 ENCOUNTER — Ambulatory Visit (INDEPENDENT_AMBULATORY_CARE_PROVIDER_SITE_OTHER): Payer: Medicaid Other | Admitting: Advanced Practice Midwife

## 2018-12-02 VITALS — BP 111/73 | HR 82 | Wt 152.0 lb

## 2018-12-02 DIAGNOSIS — Z3483 Encounter for supervision of other normal pregnancy, third trimester: Secondary | ICD-10-CM

## 2018-12-02 DIAGNOSIS — Z3480 Encounter for supervision of other normal pregnancy, unspecified trimester: Secondary | ICD-10-CM

## 2018-12-02 DIAGNOSIS — Z3A38 38 weeks gestation of pregnancy: Secondary | ICD-10-CM

## 2018-12-02 NOTE — Progress Notes (Signed)
   PRENATAL VISIT NOTE  Subjective:  Madison Stone is a 23 y.o. [phone] at [redacted]w[redacted]d being seen today for ongoing prenatal care.  She is currently monitored for the following issues for this high-risk pregnancy and has Migraine with aura and without status migrainosus, not intractable; Pregnancy headache in second trimester; Supervision of other normal pregnancy, antepartum; Low birth weight or preterm infant, 1750-1999 grams; History of preterm delivery; and Substance abuse affecting pregnancy in third trimester, antepartum on their problem list.  Patient reports occasional contractions.  Contractions: Irregular. Vag. Bleeding: None.  Movement: Present. Denies leaking of fluid.   The following portions of the patient's history were reviewed and updated as appropriate: allergies, current medications, past family history, past medical history, past social history, past surgical history and problem list.   Objective:   Vitals:   12/02/18 0814  BP: 111/73  Pulse: 82  Weight: 68.9 kg    Fetal Status: Fetal Heart Rate (bpm): 144 Fundal Height: 37 cm Movement: Present     General:  Alert, oriented and cooperative. Patient is in no acute distress.  Skin: Skin is warm and dry. No rash noted.   Cardiovascular: Normal heart rate noted  Respiratory: Normal respiratory effort, no problems with respiration noted  Abdomen: Soft, gravid, appropriate for gestational age.  Pain/Pressure: Present     Pelvic: Cervical exam performed Dilation: 2.5 Effacement (%): 70 Station: -3, -2  Extremities: Normal range of motion.  Edema: Trace  Mental Status: Normal mood and affect. Normal behavior. Normal judgment and thought content.   Assessment and Plan:  Pregnancy: [id number] at [redacted]w[redacted]d There are no diagnoses linked to this encounter. Term labor symptoms and general obstetric precautions including but not limited to vaginal bleeding, contractions, leaking of fluid and fetal movement were reviewed in detail with the  patient. Please refer to After Visit Summary for other counseling recommendations.   Return in about 1 week (around 12/09/2018) for [facility].   Hansel Feinstein, CNM

## 2018-12-02 NOTE — Patient Instructions (Signed)

## 2018-12-05 ENCOUNTER — Encounter (HOSPITAL_COMMUNITY): Payer: Self-pay | Admitting: *Deleted

## 2018-12-05 ENCOUNTER — Inpatient Hospital Stay (HOSPITAL_BASED_OUTPATIENT_CLINIC_OR_DEPARTMENT_OTHER): Payer: Medicaid Other

## 2018-12-05 ENCOUNTER — Other Ambulatory Visit: Payer: Self-pay

## 2018-12-05 ENCOUNTER — Encounter: Payer: Self-pay | Admitting: Advanced Practice Midwife

## 2018-12-05 ENCOUNTER — Inpatient Hospital Stay (HOSPITAL_COMMUNITY)
Admission: AD | Admit: 2018-12-05 | Discharge: 2018-12-05 | Disposition: A | Discharge status: Home / Self Care | Payer: Medicaid Other | Attending: Obstetrics & Gynecology | Admitting: Obstetrics & Gynecology

## 2018-12-05 DIAGNOSIS — O368130 Decreased fetal movements, third trimester, not applicable or unspecified: Secondary | ICD-10-CM | POA: Insufficient documentation

## 2018-12-05 DIAGNOSIS — Z3689 Encounter for other specified antenatal screening: Secondary | ICD-10-CM

## 2018-12-05 DIAGNOSIS — E282 Polycystic ovarian syndrome: Secondary | ICD-10-CM | POA: Insufficient documentation

## 2018-12-05 DIAGNOSIS — O99283 Endocrine, nutritional and metabolic diseases complicating pregnancy, third trimester: Secondary | ICD-10-CM | POA: Insufficient documentation

## 2018-12-05 DIAGNOSIS — Z3A38 38 weeks gestation of pregnancy: Secondary | ICD-10-CM | POA: Diagnosis not present

## 2018-12-05 DIAGNOSIS — Z87891 Personal history of nicotine dependence: Secondary | ICD-10-CM | POA: Diagnosis not present

## 2018-12-05 DIAGNOSIS — O479 False labor, unspecified: Secondary | ICD-10-CM

## 2018-12-05 DIAGNOSIS — Z87442 Personal history of urinary calculi: Secondary | ICD-10-CM | POA: Diagnosis not present

## 2018-12-05 MED ORDER — CYCLOBENZAPRINE HCL 10 MG PO TABS
10.0000 mg | ORAL_TABLET | Freq: Once | ORAL | Status: AC
  Administered 2018-12-05: 10 mg via ORAL
  Filled 2018-12-05: qty 1

## 2018-12-05 NOTE — MAU Provider Note (Signed)
History     CSN: [id number]  Arrival date and time: 12/05/18 2000   First Provider Initiated Contact with Patient 12/05/18 2047      Chief Complaint  Patient presents with  . Decreased Fetal Movement  . Contractions   Madison Stone is a 23 y.o. G5P3 at 2887w5d who presents to MAU with complaints of decreased fetal movement. She reports she has not felt baby move since around 0900 this morning. Has tried coffee, cold water, walking and hot shower to feel movement but denies ever feeling movement prior to coming to MAU. Since being in MAU patient reports feeling 1 movement in triage. She denies vaginal bleeding or LOF. She reports DFM is associated with contractions. Reports contractions have been present since Tuesday after office appointment. Reports contractions occur every 4-5 minutes.    OB History    Gravida  5   Para  3   Term  1   Preterm  2   AB  1   Living  3     SAB  1   TAB  0   Ectopic  0   Multiple  0   Live Births  3           Past Medical History:  Diagnosis Date  . Heart murmur   . History of broken collarbone    age 89  . Infection    UTI  . Kidney stones   . Ovarian cyst   . PCOS (polycystic ovarian syndrome)     Past Surgical History:  Procedure Laterality Date  . CHOLECYSTECTOMY    . OVARIAN CYST REMOVAL     3 separate surgeries     Family History  Problem Relation Age of Onset  . COPD Maternal Grandmother   . Cancer Paternal Grandmother   . Cancer Paternal Grandfather        lung    Social History   Tobacco Use  . Smoking status: Former Smoker    Types: Cigarettes  . Smokeless tobacco: Never Used  . Tobacco comment: quit with first preg  Substance Use Topics  . Alcohol use: Not Currently    Frequency: Never  . Drug use: Not Currently    Types: Marijuana    Comment: last in July    Allergies:  Allergies  Allergen Reactions  . Latex Hives  . Amoxicillin Nausea And Vomiting    Medications Prior to  Admission  Medication Sig Dispense Refill Last Dose  . Prenatal Vit-Fe Fumarate-FA (PRENATAL VITAMINS PO) Take 1 tablet by mouth daily.   Past Week at Unknown time  . zolpidem (AMBIEN) 5 MG tablet Take 1 tablet (5 mg total) by mouth at bedtime as needed for sleep. 10 tablet 0 12/05/2018 at Unknown time  . zolpidem (AMBIEN) 5 MG tablet Take 1 tablet (5 mg total) by mouth at bedtime as needed for sleep. 5 tablet 0 Past Week at Unknown time    Review of Systems  Constitutional: Negative.   Respiratory: Negative.   Cardiovascular: Negative.   Gastrointestinal: Positive for abdominal pain. Negative for constipation, diarrhea, nausea and vomiting.       Contractions  Genitourinary: Negative.   Neurological: Negative.   Psychiatric/Behavioral: Negative.    Physical Exam   Blood pressure 110/63, pulse 81, temperature 98 F (36.7 C), temperature source Oral, resp. rate 19, weight 69.2 kg, last menstrual period 03/09/2018.  Physical Exam  Nursing note and vitals reviewed. Constitutional: She is oriented to person, place, and time. She  appears well-developed and well-nourished. No distress.  Cardiovascular: Normal rate and regular rhythm.  Respiratory: Effort normal and breath sounds normal. No respiratory distress. She has no wheezes.  GI: Soft. There is no abdominal tenderness. There is no rebound.  Gravid appropriate for gestational age, mild contractions palpated. No fetal movement palpated by CNM   Musculoskeletal: Normal range of motion.  Neurological: She is alert and oriented to person, place, and time.  Psychiatric: She has a normal mood and affect. Thought content normal.   Dilation: 3.5 Effacement (%): 60 Cervical Position: Posterior Station: -3 Presentation: Vertex Exam by:: Wende Bushy CNM  Upon chart review patient was 2.5cm in office on 9/15 + scalp stimulation at 2050 during cervical examination   FHR: 140/moderate/+accels/ no decelerations  TOCO: 4-5 minutes/ mild by  palpation   MAU Course  Procedures  MDM NST - reactive after 40 minutes of tracing  Cervical examination  BPP - 8/8  Patient requesting pain medication for back pain from contractions. Flexeril given in MAU.   Rechecked cervix 20 minutes after Flexeril, no cervical change. Patient reports fetal movement. NST reactive.   Educated and discussed early labor with patient, encouraged rest and hydration at home during this stage. Discussed reasons to return to MAU. Pt stable at time of discharge.   Assessment and Plan   1. Decreased fetal movements in third trimester, single or unspecified fetus   2. NST (non-stress test) reactive   3. Labor, false (Braxton-Hicks), antepartum   4. [redacted] weeks gestation of pregnancy    Discharge home  Early labor precautions, rest and hydration  FKC reviewed  Return to MAU as needed Follow up as scheduled for prenatal appointments   Follow-up Information    [facility] Follow up.   Specialty: Obstetrics and Gynecology Why: Return to MAU as needed for labor evaluation  Contact information: [address] [id number] [place] [phone]         Allergies as of 12/05/2018      Reactions   Latex Hives   Amoxicillin Nausea And Vomiting      Medication List    TAKE these medications   PRENATAL VITAMINS PO Take 1 tablet by mouth daily.   zolpidem 5 MG tablet Commonly known as: AMBIEN Take 1 tablet (5 mg total) by mouth at bedtime as needed for sleep.   zolpidem 5 MG tablet Commonly known as: AMBIEN Take 1 tablet (5 mg total) by mouth at bedtime as needed for sleep.       Lajean Manes CNM 12/05/2018, 11:06 PM

## 2018-12-05 NOTE — Discharge Instructions (Signed)
Reasons to return to MAU:  1.  Contractions are  5 minutes apart or less, each last 1 minute, these have been going on for 1-2 hours, and you cannot walk or talk during them 2.  You have a large gush of fluid, or a trickle of fluid that will not stop and you have to wear a pad 3.  You have bleeding that is bright red, heavier than spotting--like menstrual bleeding (spotting can be normal in early labor or after a check of your cervix) 4.  You do not feel the baby moving like he/she normally does   Hydrate and rest during early labor

## 2018-12-05 NOTE — MAU Note (Signed)
D/C signature pad patient signed in wrong box.

## 2018-12-05 NOTE — MAU Note (Signed)
Pt reports to MAU c/o NO FM since 0900. Pt reports she tried coffee, walking, warm showers, and nothing is working. Pt reports no bleeding or LOF. Pt is ctx every 64min. FHR 140. Pt felt 1 FM in triage when doppler was placed.

## 2018-12-06 ENCOUNTER — Inpatient Hospital Stay (HOSPITAL_COMMUNITY)
Admission: AD | Admit: 2018-12-06 | Discharge: 2018-12-09 | DRG: 807 | Disposition: A | Discharge status: Home / Self Care | Payer: Medicaid Other | Attending: Obstetrics and Gynecology | Admitting: Obstetrics and Gynecology

## 2018-12-06 ENCOUNTER — Other Ambulatory Visit: Payer: Self-pay

## 2018-12-06 ENCOUNTER — Encounter (HOSPITAL_COMMUNITY): Payer: Self-pay

## 2018-12-06 DIAGNOSIS — O9089 Other complications of the puerperium, not elsewhere classified: Secondary | ICD-10-CM | POA: Diagnosis not present

## 2018-12-06 DIAGNOSIS — R109 Unspecified abdominal pain: Secondary | ICD-10-CM | POA: Diagnosis not present

## 2018-12-06 DIAGNOSIS — O9902 Anemia complicating childbirth: Principal | ICD-10-CM | POA: Diagnosis present

## 2018-12-06 DIAGNOSIS — D649 Anemia, unspecified: Secondary | ICD-10-CM | POA: Diagnosis present

## 2018-12-06 DIAGNOSIS — R4182 Altered mental status, unspecified: Secondary | ICD-10-CM | POA: Diagnosis not present

## 2018-12-06 DIAGNOSIS — Z20828 Contact with and (suspected) exposure to other viral communicable diseases: Secondary | ICD-10-CM | POA: Diagnosis present

## 2018-12-06 DIAGNOSIS — O9989 Other specified diseases and conditions complicating pregnancy, childbirth and the puerperium: Secondary | ICD-10-CM | POA: Diagnosis not present

## 2018-12-06 DIAGNOSIS — R079 Chest pain, unspecified: Secondary | ICD-10-CM | POA: Diagnosis not present

## 2018-12-06 DIAGNOSIS — R0602 Shortness of breath: Secondary | ICD-10-CM | POA: Diagnosis not present

## 2018-12-06 DIAGNOSIS — Z3A39 39 weeks gestation of pregnancy: Secondary | ICD-10-CM

## 2018-12-06 DIAGNOSIS — I959 Hypotension, unspecified: Secondary | ICD-10-CM | POA: Diagnosis not present

## 2018-12-06 DIAGNOSIS — Z3A38 38 weeks gestation of pregnancy: Secondary | ICD-10-CM

## 2018-12-06 DIAGNOSIS — Z3490 Encounter for supervision of normal pregnancy, unspecified, unspecified trimester: Secondary | ICD-10-CM

## 2018-12-06 DIAGNOSIS — Z87891 Personal history of nicotine dependence: Secondary | ICD-10-CM

## 2018-12-06 DIAGNOSIS — T68XXXA Hypothermia, initial encounter: Secondary | ICD-10-CM | POA: Diagnosis not present

## 2018-12-06 NOTE — H&P (Addendum)
LABOR AND DELIVERY ADMISSION HISTORY AND PHYSICAL NOTE  Madison Stone is a 23 y.o. female [phone] with IUP at [redacted]w[redacted]d by 6 wk [place] presenting for spontaneous labor. Reports that she has had contractions for the last 24 hrs. Initially 10 mins apart and now 2-3 mins apart. She reports good fetal movement. She denies leakage of fluid or vaginal bleeding but has had mucus plug.   She plans on breast and bottle feeding. She declined birth control. Desires epidural.   Prenatal History/Complications: PNC at [facility] [place]:  @[redacted]w[redacted]d , CWD, normal anatomy, cephalic presentation, 59%ile, EFW 574g Pregnancy complications:  - History of preterm delivery - +MJ on UDS 11/15/2018  Past Medical History: Past Medical History:  Diagnosis Date  . Heart murmur   . History of broken collarbone    age 73  . Infection    UTI  . Kidney stones   . Ovarian cyst   . PCOS (polycystic ovarian syndrome)   . Preterm labor     Past Surgical History: Past Surgical History:  Procedure Laterality Date  . CHOLECYSTECTOMY    . OVARIAN CYST REMOVAL     3 separate surgeries     Obstetrical History: OB History    Gravida  5   Para  3   Term  1   Preterm  2   AB  1   Living  3     SAB  1   TAB  0   Ectopic  0   Multiple  0   Live Births  3           Social History: Social History   Socioeconomic History  . Marital status: Single    Spouse name: Not on file  . Number of children: Not on file  . Years of education: Not on file  . Highest education level: Not on file  Occupational History  . Not on file  Social Needs  . Financial resource strain: Not on file  . Food insecurity    Worry: Not on file    Inability: Not on file  . Transportation needs    Medical: Not on file    Non-medical: Not on file  Tobacco Use  . Smoking status: Former Smoker    Types: Cigarettes  . Smokeless tobacco: Never Used  . Tobacco comment: quit with first preg  Substance and Sexual Activity  . Alcohol  use: Not Currently    Frequency: Never  . Drug use: Not Currently    Types: Marijuana    Comment: last in July  . Sexual activity: Yes    Partners: Male    Birth control/protection: None  Lifestyle  . Physical activity    Days per week: Not on file    Minutes per session: Not on file  . Stress: Not on file  Relationships  . Social Musician on phone: Not on file    Gets together: Not on file    Attends religious service: Not on file    Active member of club or organization: Not on file    Attends meetings of clubs or organizations: Not on file    Relationship status: Not on file  Other Topics Concern  . Not on file  Social History Narrative  . Not on file    Family History: Family History  Problem Relation Age of Onset  . COPD Maternal Grandmother   . Cancer Paternal Grandmother   . Cancer Paternal Grandfather  lung    Allergies: Allergies  Allergen Reactions  . Latex Hives  . Amoxicillin Nausea And Vomiting    Medications Prior to Admission  Medication Sig Dispense Refill Last Dose  . Prenatal Vit-Fe Fumarate-FA (PRENATAL VITAMINS PO) Take 1 tablet by mouth daily.   12/06/2018 at Unknown time  . zolpidem (AMBIEN) 5 MG tablet Take 1 tablet (5 mg total) by mouth at bedtime as needed for sleep. 10 tablet 0 Past Week at Unknown time  . zolpidem (AMBIEN) 5 MG tablet Take 1 tablet (5 mg total) by mouth at bedtime as needed for sleep. 5 tablet 0      Review of Systems  All systems reviewed and negative except as stated in HPI  Physical Exam Blood pressure 128/67, pulse 87, temperature 97.6 F (36.4 C), temperature source Oral, resp. rate 20, height 5\' 1"  (1.549 m), weight 69.9 kg, last menstrual period 03/09/2018, SpO2 97 %. General appearance: alert, oriented, NAD Lungs: normal respiratory effort Heart: regular rate Abdomen: soft, non-tender; gravid, leopolds 6-7lb Extremities: No calf swelling or tenderness Presentation: cephalic by cervical  exam  Fetal monitoringBaseline: 140 bpm, Variability: Good {> 6 bpm), Accelerations: Reactive and Decelerations: Absent Uterine activityFrequency: Every 4-5 minutes Dilation: 7 Effacement (%): 90 Station: 0 Exam by:: Gillian ShieldsJ Sanchez RN  Prenatal labs: ABO, Rh: --/--/O POS, O POS Performed at [facility], [address] [place], [place] [address]  [phone](09/20 0015) Antibody: NEG (09/20 0015) Rubella: 3.50 (04/29 1542) RPR: Non Reactive (06/22 0846)  HBsAg: Negative (04/29 1542)  HIV: Non Reactive (06/22 0846)  GC/Chlamydia: neg/neg 11/18/2018  GBS: Negative/-- (09/01 1019)  2-hr GTT: negative 09/08/2018 Genetic screening:  Normal Panorama Anatomy US: normal  Prenatal Transfer Tool  Maternal Diabetes: No Genetic Screening: Normal Maternal Ultrasounds/Referrals: Normal Fetal Ultrasounds or other Referrals:  None Maternal Substance Abuse:  Yes:  Type: Marijuana Significant Maternal Medications:  None Significant Maternal Lab Results: Group B Strep negative  Results for orders placed or performed during the hospital encounter of 12/06/18 (from the past 24 hour(s))  CBC   Collection Time: 12/07/18 12:15 AM  Result Value Ref Range   WBC 8.8 4.0 - 10.5 K/uL   RBC 3.62 (L) 3.87 - 5.11 MIL/uL   Hemoglobin 11.5 (L) 12.0 - 15.0 g/dL   HCT 60.432.9 (L) 54.036.0 - 98.146.0 %   MCV 90.9 80.0 - 100.0 fL   MCH 31.8 26.0 - 34.0 pg   MCHC 35.0 30.0 - 36.0 g/dL   RDW 19.113.5 47.811.5 - 29.515.5 %   Platelets 247 150 - 400 K/uL   nRBC 0.0 0.0 - 0.2 %  Type and screen [facility]   Collection Time: 12/07/18 12:15 AM  Result Value Ref Range   ABO/RH(D) O POS    Antibody Screen NEG    Sample Expiration      12/10/2018,2359 Performed at [facility], [address] [place], [place] [address]   ABO/Rh   Collection Time: 12/07/18 12:15 AM  Result Value Ref Range   ABO/RH(D)      O POS Performed at [facility], [address] [place], [place] [address]     Patient Active  Problem List   Diagnosis Date Noted  . Labor and delivery, indication for care 12/07/2018  . Normal labor 12/07/2018  . Substance abuse affecting pregnancy in third trimester, antepartum 11/15/2018  . History of preterm delivery 10/07/2018  . Supervision of other normal pregnancy, antepartum 07/16/2018  . Low  birth weight or preterm infant, 1750-1999 grams 07/16/2018  . Migraine with aura and without status migrainosus, not intractable 07/04/2018  . Pregnancy headache in second trimester 07/04/2018    Assessment: Madison Stone is a 23 y.o. [id number] at [redacted]w[redacted]d here for spontaneous labor.  #Labor: cervical change 5.5-->7. Contractions still somewhat spaced, will augment with pitocin if required. #Pain:Epidural at patient request #FWB: Cat I #GBS/ID: Negative #COVID: swab pending #MOF: Breast and bottle  #MOC: Declined  #Circ:  n/a   Lattie Haw MD PGY-1, [facility]  12/07/2018, 1:43 AM   OB FELLOW ATTESTATION  I have seen and examined this patient and agree with above documentation in the resident's note except as noted below.  No changes  Augustin Coupe, MD/MPH OB Fellow  12/07/2018, 9:06 AM

## 2018-12-06 NOTE — MAU Note (Signed)
Presents with cxts every 3-5 min apart most the day; increasing in intensity a couple of hours ago. Pt had membranes swept yesterday and starting having spotting afterwards. Pt lost mucous plug approx 2 hrs ago. No LOF. Dec FM.   Gilmer Mor RN

## 2018-12-07 ENCOUNTER — Encounter (HOSPITAL_COMMUNITY): Payer: Self-pay

## 2018-12-07 ENCOUNTER — Other Ambulatory Visit: Payer: Self-pay

## 2018-12-07 ENCOUNTER — Inpatient Hospital Stay (HOSPITAL_COMMUNITY): Payer: Medicaid Other | Admitting: Anesthesiology

## 2018-12-07 ENCOUNTER — Inpatient Hospital Stay (HOSPITAL_COMMUNITY): Payer: Medicaid Other

## 2018-12-07 DIAGNOSIS — Z20828 Contact with and (suspected) exposure to other viral communicable diseases: Secondary | ICD-10-CM | POA: Diagnosis present

## 2018-12-07 DIAGNOSIS — R0602 Shortness of breath: Secondary | ICD-10-CM | POA: Diagnosis not present

## 2018-12-07 DIAGNOSIS — D649 Anemia, unspecified: Secondary | ICD-10-CM | POA: Diagnosis present

## 2018-12-07 DIAGNOSIS — O9089 Other complications of the puerperium, not elsewhere classified: Secondary | ICD-10-CM | POA: Diagnosis not present

## 2018-12-07 DIAGNOSIS — O9902 Anemia complicating childbirth: Secondary | ICD-10-CM | POA: Diagnosis present

## 2018-12-07 DIAGNOSIS — Z3A38 38 weeks gestation of pregnancy: Secondary | ICD-10-CM

## 2018-12-07 DIAGNOSIS — Z87891 Personal history of nicotine dependence: Secondary | ICD-10-CM | POA: Diagnosis not present

## 2018-12-07 DIAGNOSIS — R079 Chest pain, unspecified: Secondary | ICD-10-CM | POA: Diagnosis not present

## 2018-12-07 DIAGNOSIS — R404 Transient alteration of awareness: Secondary | ICD-10-CM | POA: Diagnosis not present

## 2018-12-07 DIAGNOSIS — O9989 Other specified diseases and conditions complicating pregnancy, childbirth and the puerperium: Secondary | ICD-10-CM | POA: Diagnosis not present

## 2018-12-07 DIAGNOSIS — R4182 Altered mental status, unspecified: Secondary | ICD-10-CM

## 2018-12-07 DIAGNOSIS — O26893 Other specified pregnancy related conditions, third trimester: Secondary | ICD-10-CM | POA: Diagnosis present

## 2018-12-07 DIAGNOSIS — T68XXXA Hypothermia, initial encounter: Secondary | ICD-10-CM | POA: Diagnosis not present

## 2018-12-07 DIAGNOSIS — I959 Hypotension, unspecified: Secondary | ICD-10-CM | POA: Diagnosis not present

## 2018-12-07 DIAGNOSIS — R4189 Other symptoms and signs involving cognitive functions and awareness: Secondary | ICD-10-CM | POA: Diagnosis not present

## 2018-12-07 DIAGNOSIS — R109 Unspecified abdominal pain: Secondary | ICD-10-CM | POA: Diagnosis not present

## 2018-12-07 LAB — RAPID URINE DRUG SCREEN, HOSP PERFORMED
Amphetamines: NOT DETECTED
Barbiturates: NOT DETECTED
Benzodiazepines: NOT DETECTED
Cocaine: NOT DETECTED
Opiates: NOT DETECTED
Tetrahydrocannabinol: POSITIVE — AB

## 2018-12-07 LAB — URINALYSIS, ROUTINE W REFLEX MICROSCOPIC
Bacteria, UA: NONE SEEN
Bilirubin Urine: NEGATIVE
Glucose, UA: NEGATIVE mg/dL
Ketones, ur: 20 mg/dL — AB
Nitrite: NEGATIVE
Protein, ur: 100 mg/dL — AB
RBC / HPF: >50 RBC/hpf — ABNORMAL HIGH (ref 0–5)
Specific Gravity, Urine: 1.029 (ref 1.005–1.030)
pH: 6 (ref 5.0–8.0)

## 2018-12-07 LAB — CBC
HCT: 31.3 % — ABNORMAL LOW (ref 36.0–46.0)
HCT: 32.7 % — ABNORMAL LOW (ref 36.0–46.0)
HCT: 32.9 % — ABNORMAL LOW (ref 36.0–46.0)
Hemoglobin: 11 g/dL — ABNORMAL LOW (ref 12.0–15.0)
Hemoglobin: 11.5 g/dL — ABNORMAL LOW (ref 12.0–15.0)
Hemoglobin: 11.5 g/dL — ABNORMAL LOW (ref 12.0–15.0)
MCH: 31.8 pg (ref 26.0–34.0)
MCH: 31.8 pg (ref 26.0–34.0)
MCH: 31.8 pg (ref 26.0–34.0)
MCHC: 35 g/dL (ref 30.0–36.0)
MCHC: 35.1 g/dL (ref 30.0–36.0)
MCHC: 35.2 g/dL (ref 30.0–36.0)
MCV: 90.3 fL (ref 80.0–100.0)
MCV: 90.5 fL (ref 80.0–100.0)
MCV: 90.9 fL (ref 80.0–100.0)
Platelets: 141 10*3/uL — ABNORMAL LOW (ref 150–400)
Platelets: 207 10*3/uL (ref 150–400)
Platelets: 247 10*3/uL (ref 150–400)
RBC: 3.46 MIL/uL — ABNORMAL LOW (ref 3.87–5.11)
RBC: 3.62 MIL/uL — ABNORMAL LOW (ref 3.87–5.11)
RBC: 3.62 MIL/uL — ABNORMAL LOW (ref 3.87–5.11)
RDW: 13.5 % (ref 11.5–15.5)
RDW: 13.6 % (ref 11.5–15.5)
RDW: 13.6 % (ref 11.5–15.5)
WBC: 12.2 10*3/uL — ABNORMAL HIGH (ref 4.0–10.5)
WBC: 8.7 10*3/uL (ref 4.0–10.5)
WBC: 8.8 10*3/uL (ref 4.0–10.5)
nRBC: 0 % (ref 0.0–0.2)
nRBC: 0 % (ref 0.0–0.2)
nRBC: 0 % (ref 0.0–0.2)

## 2018-12-07 LAB — GLUCOSE, CAPILLARY
Glucose-Capillary: 75 mg/dL (ref 70–99)
Glucose-Capillary: 65 mg/dL — ABNORMAL LOW (ref 70–99)

## 2018-12-07 LAB — COMPREHENSIVE METABOLIC PANEL
ALT: 11 U/L (ref 0–44)
ALT: 11 U/L (ref 0–44)
ALT: 10 U/L (ref 0–44)
AST: 15 U/L (ref 15–41)
AST: 13 U/L — ABNORMAL LOW (ref 15–41)
AST: 15 U/L (ref 15–41)
Albumin: 2.4 g/dL — ABNORMAL LOW (ref 3.5–5.0)
Albumin: 2.2 g/dL — ABNORMAL LOW (ref 3.5–5.0)
Albumin: 2.1 g/dL — ABNORMAL LOW (ref 3.5–5.0)
Alkaline Phosphatase: 81 U/L (ref 38–126)
Alkaline Phosphatase: 80 U/L (ref 38–126)
Alkaline Phosphatase: 73 U/L (ref 38–126)
Anion gap: 10 (ref 5–15)
Anion gap: 8 (ref 5–15)
Anion gap: 7 (ref 5–15)
BUN: <5 mg/dL — ABNORMAL LOW (ref 6–20)
BUN: <5 mg/dL — ABNORMAL LOW (ref 6–20)
BUN: <5 mg/dL — ABNORMAL LOW (ref 6–20)
CO2: 20 mmol/L — ABNORMAL LOW (ref 22–32)
CO2: 21 mmol/L — ABNORMAL LOW (ref 22–32)
CO2: 19 mmol/L — ABNORMAL LOW (ref 22–32)
Calcium: 8.5 mg/dL — ABNORMAL LOW (ref 8.9–10.3)
Calcium: 8.6 mg/dL — ABNORMAL LOW (ref 8.9–10.3)
Calcium: 7.8 mg/dL — ABNORMAL LOW (ref 8.9–10.3)
Chloride: 104 mmol/L (ref 98–111)
Chloride: 107 mmol/L (ref 98–111)
Chloride: 105 mmol/L (ref 98–111)
Creatinine, Ser: 0.41 mg/dL — ABNORMAL LOW (ref 0.44–1.00)
Creatinine, Ser: 0.36 mg/dL — ABNORMAL LOW (ref 0.44–1.00)
Creatinine, Ser: 0.49 mg/dL (ref 0.44–1.00)
GFR calc Af Amer: >60 mL/min (ref >60)
GFR calc Af Amer: >60 mL/min (ref >60)
GFR calc Af Amer: >60 mL/min (ref >60)
GFR calc non Af Amer: >60 mL/min (ref >60)
GFR calc non Af Amer: >60 mL/min (ref >60)
GFR calc non Af Amer: >60 mL/min (ref >60)
Glucose, Bld: 92 mg/dL (ref 70–99)
Glucose, Bld: 82 mg/dL (ref 70–99)
Glucose, Bld: 73 mg/dL (ref 70–99)
Potassium: 3.6 mmol/L (ref 3.5–5.1)
Potassium: 3.4 mmol/L — ABNORMAL LOW (ref 3.5–5.1)
Potassium: 3.7 mmol/L (ref 3.5–5.1)
Sodium: 134 mmol/L — ABNORMAL LOW (ref 135–145)
Sodium: 136 mmol/L (ref 135–145)
Sodium: 131 mmol/L — ABNORMAL LOW (ref 135–145)
Total Bilirubin: 0.2 mg/dL — ABNORMAL LOW (ref 0.3–1.2)
Total Bilirubin: 0.5 mg/dL (ref 0.3–1.2)
Total Bilirubin: 0.6 mg/dL (ref 0.3–1.2)
Total Protein: 5.4 g/dL — ABNORMAL LOW (ref 6.5–8.1)
Total Protein: 5.2 g/dL — ABNORMAL LOW (ref 6.5–8.1)
Total Protein: 4.9 g/dL — ABNORMAL LOW (ref 6.5–8.1)

## 2018-12-07 LAB — CBC WITH DIFFERENTIAL/PLATELET
Abs Immature Granulocytes: 0.04 10*3/uL (ref 0.00–0.07)
Basophils Absolute: 0 10*3/uL (ref 0.0–0.1)
Basophils Relative: 0 %
Eosinophils Absolute: 0 10*3/uL (ref 0.0–0.5)
Eosinophils Relative: 1 %
HCT: 32.1 % — ABNORMAL LOW (ref 36.0–46.0)
Hemoglobin: 10.7 g/dL — ABNORMAL LOW (ref 12.0–15.0)
Immature Granulocytes: 1 %
Lymphocytes Relative: 35 %
Lymphs Abs: 2.7 10*3/uL (ref 0.7–4.0)
MCH: 30.1 pg (ref 26.0–34.0)
MCHC: 33.3 g/dL (ref 30.0–36.0)
MCV: 90.2 fL (ref 80.0–100.0)
Monocytes Absolute: 0.7 10*3/uL (ref 0.1–1.0)
Monocytes Relative: 8 %
Neutro Abs: 4.4 10*3/uL (ref 1.7–7.7)
Neutrophils Relative %: 55 %
Platelets: 224 10*3/uL (ref 150–400)
RBC: 3.56 MIL/uL — ABNORMAL LOW (ref 3.87–5.11)
RDW: 13.5 % (ref 11.5–15.5)
WBC: 7.9 10*3/uL (ref 4.0–10.5)
nRBC: 0 % (ref 0.0–0.2)

## 2018-12-07 LAB — TYPE AND SCREEN
ABO/RH(D): O POS
Antibody Screen: NEGATIVE

## 2018-12-07 LAB — PROTIME-INR
INR: 1.1 (ref 0.8–1.2)
Prothrombin Time: 13.7 seconds (ref 11.4–15.2)

## 2018-12-07 LAB — INFLUENZA PANEL BY PCR (TYPE A & B)
Influenza A By PCR: NEGATIVE
Influenza B By PCR: NEGATIVE

## 2018-12-07 LAB — ABO/RH: ABO/RH(D): O POS

## 2018-12-07 LAB — SARS CORONAVIRUS 2 BY RT PCR (HOSPITAL ORDER, PERFORMED IN ~~LOC~~ HOSPITAL LAB): SARS Coronavirus 2: NEGATIVE

## 2018-12-07 LAB — LACTIC ACID, PLASMA
Lactic Acid, Venous: 1.7 mmol/L (ref 0.5–1.9)
Lactic Acid, Venous: 1 mmol/L (ref 0.5–1.9)

## 2018-12-07 LAB — APTT: aPTT: 28 seconds (ref 24–36)

## 2018-12-07 LAB — RPR: RPR Ser Ql: NONREACTIVE

## 2018-12-07 MED ORDER — LACTATED RINGERS IV SOLN
500.0000 mL | INTRAVENOUS | Status: DC | PRN
  Administered 2018-12-07 (×2): 500 mL via INTRAVENOUS

## 2018-12-07 MED ORDER — TERBUTALINE SULFATE 1 MG/ML IJ SOLN: 0.2500 mg | Freq: Once | INTRAMUSCULAR | Status: DC | PRN

## 2018-12-07 MED ORDER — OXYCODONE-ACETAMINOPHEN 5-325 MG PO TABS: 2.0000 | ORAL_TABLET | ORAL | Status: DC | PRN

## 2018-12-07 MED ORDER — DIPHENHYDRAMINE HCL 50 MG/ML IJ SOLN
12.5000 mg | INTRAMUSCULAR | Status: AC | PRN
  Administered 2018-12-07 (×3): 12.5 mg via INTRAVENOUS
  Filled 2018-12-07 (×3): qty 1

## 2018-12-07 MED ORDER — OXYTOCIN 40 UNITS IN NORMAL SALINE INFUSION - SIMPLE MED: 2.5000 [IU]/h | INTRAVENOUS | Status: DC

## 2018-12-07 MED ORDER — ONDANSETRON HCL 4 MG/2ML IJ SOLN: 4.0000 mg | Freq: Four times a day (QID) | INTRAMUSCULAR | Status: DC | PRN

## 2018-12-07 MED ORDER — EPHEDRINE 5 MG/ML INJ
10.0000 mg | INTRAVENOUS | Status: DC | PRN
  Filled 2018-12-07: qty 2

## 2018-12-07 MED ORDER — FENTANYL CITRATE (PF) 100 MCG/2ML IJ SOLN
50.0000 ug | INTRAMUSCULAR | Status: DC | PRN
  Administered 2018-12-07 (×2): 100 ug via INTRAVENOUS
  Filled 2018-12-07 (×2): qty 2

## 2018-12-07 MED ORDER — SOD CITRATE-CITRIC ACID 500-334 MG/5ML PO SOLN: 30.0000 mL | ORAL | Status: DC | PRN

## 2018-12-07 MED ORDER — LORAZEPAM 2 MG/ML IJ SOLN
INTRAMUSCULAR | Status: AC
  Administered 2018-12-07: 0.5 mg via INTRAVENOUS
  Filled 2018-12-07: qty 1

## 2018-12-07 MED ORDER — OXYCODONE-ACETAMINOPHEN 5-325 MG PO TABS: 1.0000 | ORAL_TABLET | ORAL | Status: DC | PRN

## 2018-12-07 MED ORDER — DIPHENHYDRAMINE HCL 50 MG/ML IJ SOLN
50.0000 mg | Freq: Once | INTRAMUSCULAR | Status: AC
  Administered 2018-12-07: 50 mg via INTRAVENOUS

## 2018-12-07 MED ORDER — LACTATED RINGERS IV BOLUS
1000.0000 mL | Freq: Once | INTRAVENOUS | Status: AC
  Administered 2018-12-07: 1000 mL via INTRAVENOUS

## 2018-12-07 MED ORDER — LACTATED RINGERS IV SOLN
INTRAVENOUS | Status: DC
  Administered 2018-12-07 (×2): via INTRAVENOUS

## 2018-12-07 MED ORDER — PHENYLEPHRINE 40 MCG/ML (10ML) SYRINGE FOR IV PUSH (FOR BLOOD PRESSURE SUPPORT)
80.0000 ug | PREFILLED_SYRINGE | INTRAVENOUS | Status: DC | PRN
  Filled 2018-12-07 (×2): qty 10

## 2018-12-07 MED ORDER — LACTATED RINGERS IV SOLN: 500.0000 mL | INTRAVENOUS | Status: DC | PRN

## 2018-12-07 MED ORDER — PHENYLEPHRINE 40 MCG/ML (10ML) SYRINGE FOR IV PUSH (FOR BLOOD PRESSURE SUPPORT)
80.0000 ug | PREFILLED_SYRINGE | INTRAVENOUS | Status: AC | PRN
  Administered 2018-12-07 (×3): 80 ug via INTRAVENOUS

## 2018-12-07 MED ORDER — OXYTOCIN BOLUS FROM INFUSION: 500.0000 mL | Freq: Once | INTRAVENOUS | Status: DC

## 2018-12-07 MED ORDER — SODIUM CHLORIDE (PF) 0.9 % IJ SOLN
INTRAMUSCULAR | Status: DC | PRN
  Administered 2018-12-07: 12 mL/h via EPIDURAL

## 2018-12-07 MED ORDER — LIDOCAINE HCL (PF) 1 % IJ SOLN: 30.0000 mL | INTRAMUSCULAR | Status: DC | PRN

## 2018-12-07 MED ORDER — SODIUM CHLORIDE 0.9 % IV SOLN: 2.0000 g | Freq: Once | INTRAVENOUS | Status: DC

## 2018-12-07 MED ORDER — NALBUPHINE HCL 10 MG/ML IJ SOLN
2.5000 mg | INTRAMUSCULAR | Status: DC | PRN
  Administered 2018-12-07 (×2): 2.5 mg via INTRAVENOUS
  Filled 2018-12-07: qty 1
  Filled 2018-12-07: qty 0.25
  Filled 2018-12-07: qty 1

## 2018-12-07 MED ORDER — KETOROLAC TROMETHAMINE 30 MG/ML IJ SOLN
30.0000 mg | Freq: Once | INTRAMUSCULAR | Status: AC
  Administered 2018-12-07: 30 mg via INTRAVENOUS
  Filled 2018-12-07: qty 1

## 2018-12-07 MED ORDER — DIPHENHYDRAMINE HCL 50 MG/ML IJ SOLN
12.5000 mg | Freq: Every day | INTRAMUSCULAR | Status: DC | PRN
  Filled 2018-12-07: qty 1

## 2018-12-07 MED ORDER — OXYTOCIN BOLUS FROM INFUSION
500.0000 mL | Freq: Once | INTRAVENOUS | Status: AC
  Administered 2018-12-07: 500 mL via INTRAVENOUS

## 2018-12-07 MED ORDER — FENTANYL CITRATE (PF) 100 MCG/2ML IJ SOLN: 100.0000 ug | INTRAMUSCULAR | Status: DC | PRN

## 2018-12-07 MED ORDER — AMMONIA AROMATIC IN INHA
RESPIRATORY_TRACT | Status: AC
  Filled 2018-12-07: qty 10

## 2018-12-07 MED ORDER — LACTATED RINGERS IV SOLN
500.0000 mL | Freq: Once | INTRAVENOUS | Status: AC
  Administered 2018-12-07: 500 mL via INTRAVENOUS

## 2018-12-07 MED ORDER — FLEET ENEMA 7-19 GM/118ML RE ENEM: 1.0000 | ENEMA | Freq: Every day | RECTAL | Status: DC | PRN

## 2018-12-07 MED ORDER — SOD CITRATE-CITRIC ACID 500-334 MG/5ML PO SOLN
30.0000 mL | ORAL | Status: DC | PRN
  Filled 2018-12-07: qty 30

## 2018-12-07 MED ORDER — OXYTOCIN 40 UNITS IN NORMAL SALINE INFUSION - SIMPLE MED
1.0000 m[IU]/min | INTRAVENOUS | Status: DC
  Administered 2018-12-07: 2 m[IU]/min via INTRAVENOUS
  Filled 2018-12-07: qty 1000

## 2018-12-07 MED ORDER — LIDOCAINE HCL (PF) 1 % IJ SOLN
INTRAMUSCULAR | Status: DC | PRN
  Administered 2018-12-07 (×2): 5 mL via EPIDURAL

## 2018-12-07 MED ORDER — CLINDAMYCIN PHOSPHATE 900 MG/50ML IV SOLN
900.0000 mg | Freq: Three times a day (TID) | INTRAVENOUS | Status: AC
  Administered 2018-12-07 – 2018-12-08 (×2): 900 mg via INTRAVENOUS
  Filled 2018-12-07 (×2): qty 50

## 2018-12-07 MED ORDER — LORAZEPAM 2 MG/ML IJ SOLN
0.5000 mg | Freq: Once | INTRAMUSCULAR | Status: AC
  Administered 2018-12-07: 22:00:00 0.5 mg via INTRAVENOUS

## 2018-12-07 MED ORDER — FENTANYL-BUPIVACAINE-NACL 0.5-0.125-0.9 MG/250ML-% EP SOLN
12.0000 mL/h | EPIDURAL | Status: DC | PRN
  Administered 2018-12-07: 12 mL/h via EPIDURAL
  Filled 2018-12-07 (×2): qty 250

## 2018-12-07 MED ORDER — SODIUM CHLORIDE 0.9 % IV SOLN
2.0000 g | Freq: Two times a day (BID) | INTRAVENOUS | Status: AC
  Administered 2018-12-07: 2 g via INTRAVENOUS
  Filled 2018-12-07 (×3): qty 2

## 2018-12-07 MED ORDER — LACTATED RINGERS IV SOLN
INTRAVENOUS | Status: DC
  Administered 2018-12-07 (×2): via INTRAVENOUS

## 2018-12-07 MED ORDER — ACETAMINOPHEN 325 MG PO TABS: 650.0000 mg | ORAL_TABLET | ORAL | Status: DC | PRN

## 2018-12-07 MED ORDER — ONDANSETRON HCL 4 MG/2ML IJ SOLN
4.0000 mg | Freq: Four times a day (QID) | INTRAMUSCULAR | Status: DC | PRN
  Administered 2018-12-07: 03:00:00 4 mg via INTRAVENOUS
  Filled 2018-12-07: qty 2

## 2018-12-07 MED ORDER — SODIUM CHLORIDE 0.9 % IV SOLN
INTRAVENOUS | Status: DC
  Administered 2018-12-07: 14:00:00 via INTRAVENOUS

## 2018-12-07 NOTE — Progress Notes (Signed)
Madison Stone is a 23 y.o. [phone] at [redacted]w[redacted]d admitted for active labor  Subjective: Pt comfortable with epidural. S/O in room for support.  Objective: BP 107/74   Pulse 72   Temp 97.9 F (36.6 C) (Oral)   Resp 18   Ht 5\' 1"  (1.549 m)   Wt 69.9 kg   LMP 03/09/2018 (Exact Date)   SpO2 98%   BMI 29.11 kg/m  I/O last 3 completed shifts: In: -  Out: 804 [Urine:804] No intake/output data recorded.  FHT:  FHR: 135 bpm, variability: moderate,  accelerations:  Present,  decelerations:  Absent UC:   regular, every 3-5 minutes SVE:   Dilation: 5 Effacement (%): 70 Station: -2 Exam by:: J.Follmer,RNC  Labs: Lab Results  Component Value Date   WBC 7.9 12/07/2018   HGB 10.7 (L) 12/07/2018   HCT 32.1 (L) 12/07/2018   MCV 90.2 12/07/2018   PLT 224 12/07/2018   MDM: Temperature remains 97-98 F. UA without evidence of infection.  No respiratory symptoms and influenza panel negative.  Pt with hx of RLQ pain x 3 days prior to labor onset so appendicitis not ruled out.  Consult Dr Elly Modena with assessment and findings.  Abx to cover appendicitis ordered after consult to pharmacy,  Cefotan 2 g Q 12 hours and Clindamycin 900 mg Q 8 hours. Blood and urine cultures pending.  Assessment / Plan: Augmentation of labor Hypothermia on sepsis protocol  Labor: Pitocin restarted, off due to hypothermia episode Preeclampsia:  n/a Fetal Wellbeing:  Category I Pain Control:  Epidural I/D:  GBS neg Anticipated MOD:  NSVD  Fatima Blank 12/07/2018, 8:30 PM

## 2018-12-07 NOTE — Progress Notes (Addendum)
Madison Stone is a 23 y.o. [id number] at [redacted]w[redacted]d admitted for SOL.  Subjective: Feels very hot. Reports acute SOB and chest pain.   Objective: BP (!) 120/99   Pulse 69   Temp (!) 95.2 F (35.1 C) (Rectal)   Resp 20   Ht 5\' 1"  (1.549 m)   Wt 69.9 kg   LMP 03/09/2018 (Exact Date)   SpO2 100%   BMI 29.11 kg/m  No intake/output data recorded. Total I/O In: -  Out: 56 [Urine:75]'  Urgently called to see patient as baby had prolonged decels. Pt reported feeling excessively hot so RN took temperature. Unable to get oral or axillary temperatures. Rectal temp 95.42F. BP dropped to as low as 60 systolic. Multiple doses of phenylephrine. Epidural received at 2am. Pt denies cough, dysuria or other infective symptoms. Anaesthesiologist and Dr Danne Baxter called.  FHT: not tracing  UC:   Not tracing  SVE:   Dilation: 5 Effacement (%): 70 Station: -2 Exam by:: Dr Posey Pronto  Labs: Lab Results  Component Value Date   WBC 8.8 12/07/2018   HGB 11.5 (L) 12/07/2018   HCT 32.9 (L) 12/07/2018   MCV 90.9 12/07/2018   PLT 247 12/07/2018    Assessment / Plan: Madison Stone is a 23 y.o. [id number] at [redacted]w[redacted]d admitted for SOL.  Labor: Unknown cause of maternal hypothermia. Ordered the following: CBC, CMET, LA, blood culture, urine culture, UDS, STAT CXR, STAT EKG. LR 1L bolus stat. Q44min vitals. Consider empirical antibiotics. CXR-clear lung fields. EKG: Sinus rhythm. Pneumonia, acute PE, amniotic fluid embolism, chorioamniotis unlikely as patient is not ruptured. Maternal hypothermia likely related to anesthesia.  Fetal Wellbeing:  Continue to trace fetal HR  Pain Control:  Epidural I/D:  n/a Anticipated MOD:  NSVD anticipated. c-section if maternal/fetal indication.   Lattie Haw MD  12/07/2018, 6:28 AM

## 2018-12-07 NOTE — Consult Note (Signed)
Neurology Consultation Reason for Consult: Episodes of unresponsiveness Referring Physician: Constant, Peggy  CC: Episodes of unresponsiveness  History is obtained from: Patient  HPI: Madison Stone is a 23 y.o. female who presented with term delivery.  During labor, she did have some hypothermia and sepsis protocol was initiated.  She has had some labile blood pressures, but overall has been fairly steady.  No arrhythmias reported, though she has been tachycardic in the 150s and 160s.  Post operatively, she began having intermittent episodes of brief unresponsiveness.  She will be in bed, then close her eyes and become briefly unresponsive.  Ammonia salts reliably arouse her with no postictal state from this episode.  With no ammonia salts, the episodes are still fairly brief, few seconds in duration.  She has no history of seizures, though she does have a family member with epilepsy.  When I ask if she has a history of episodes of unresponsiveness, she indicates that she does not but she does have a history of anxiety attacks.  ROS: A 14 point ROS was performed and is negative except as noted in the HPI.   Past Medical History:  Diagnosis Date  . Heart murmur   . History of broken collarbone    age 89  . Infection    UTI  . Kidney stones   . Ovarian cyst   . PCOS (polycystic ovarian syndrome)   . Preterm labor      Family History  Problem Relation Age of Onset  . COPD Maternal Grandmother   . Cancer Paternal Grandmother   . Cancer Paternal Grandfather        lung     Social History:  reports that she has quit smoking. Her smoking use included cigarettes. She has never used smokeless tobacco. She reports previous alcohol use. She reports previous drug use. Drug: Marijuana.   Exam: Current vital signs: BP 100/76   Pulse 86   Temp 97.9 F (36.6 C) (Oral)   Resp 18   Ht 5\' 1"  (1.549 m)   Wt 69.9 kg   LMP 03/09/2018 (Exact Date)   SpO2 98%   BMI 29.11 kg/m  Vital  signs in last 24 hours: Temp:  [95.2 F (35.1 C)-97.9 F (36.6 C)] 97.9 F (36.6 C) (09/20 2001) Pulse Rate:  [56-252] 86 (09/20 2235) Resp:  [16-20] 18 (09/20 2001) BP: (61-189)/(32-104) 100/76 (09/20 2235) SpO2:  [95 %-100 %] 98 % (09/20 1145) Weight:  [69.9 kg] 69.9 kg (09/20 0246)   Physical Exam  Constitutional: Appears well-developed and well-nourished.  Psych: Affect appropriate to situation Eyes: No scleral injection HENT: No OP obstrucion Head: Normocephalic.  Cardiovascular: Normal rate and regular rhythm.  Respiratory: Effort normal, non-labored breathing GI: Soft.  No distension. There is no tenderness.  Skin: WDI  Neuro: Mental Status: Patient is awake, alert, oriented to person, place, month, year, and situation. Patient is able to give a clear and coherent history. No signs of aphasia or neglect Cranial Nerves: II: Visual Fields are full. Pupils are equal, round, and reactive to light.   III,IV, VI: EOMI without ptosis or diploplia.  V: Facial sensation is diminished on the right.  VII: Facial movement is symmetric.  VIII: hearing is intact to voice X: Uvula elevates symmetrically XI: Shoulder shrug is symmetric. XII: tongue is midline without atrophy or fasciculations.  Motor: Tone is normal. Bulk is normal. 5/5 strength was present in all four extremities.  Sensory: Sensation is diminished on the right leg but symmetric  in the arms.  Cerebellar: No clear ataxia.    I have reviewed labs in epic and the results pertinent to this consultation are: Cr 0.36   Impression: 23 year old female with recurrent episodes.  The rapid response to ammonia salts, Necko postictal state, argue for possible nonorganic pathology.  She has responded to low-dose of Ativan, and if she continues to have these episodes, I would favor repeat dose.  No evidence that these are hypoperfusion events, but I would also favor continued telemetry.  With her describing decrease  sensation on the right, further evaluation with neuroimaging is indicated.  Recommendations: 1) CT head 2) consider repeat dose of Ativan if she continues to have episodes 3) EEG in the AM.  4) Neurology will follow.    Roland Rack, MD [facility] [phone]  If 7pm- 7am, please page neurology on call as listed in [place].

## 2018-12-07 NOTE — Progress Notes (Signed)
elink sepsis monitoring complete

## 2018-12-07 NOTE — Progress Notes (Signed)
Madison Stone is a 23 y.o. [phone] at [redacted]w[redacted]d admitted for active labor  Subjective: Pt is comfortable with epidural. S/O in room for supportt.  Objective: BP 92/75   Pulse 91   Temp 97.8 F (36.6 C) (Oral)   Resp 16   Ht 5\' 1"  (1.549 m)   Wt 69.9 kg   LMP 03/09/2018 (Exact Date)   SpO2 100%   BMI 29.11 kg/m  I/O last 3 completed shifts: In: -  Out: 75 [Urine:75] Total I/O In: -  Out: 74 [Urine:74]  FHT:  FHR: 135 bpm, variability: moderate,  accelerations:  Present,  decelerations:  Absent UC:   irregular, every 10 minutes SVE:   Dilation: 5 Effacement (%): 70 Station: -2 Exam by:: J.Follmer,RNC  Labs: Lab Results  Component Value Date   WBC 8.7 12/07/2018   HGB 11.5 (L) 12/07/2018   HCT 32.7 (L) 12/07/2018   MCV 90.3 12/07/2018   PLT 141 (L) 12/07/2018   MDM: Pt reports 3 days of RLQ sharp shooting  Pain and n/v prior to hospital admission.  No respiratory symptoms, no urinary symptoms, no other pain.  Pt feels hot with chills intermittently.  Consider appendicitis in differential.  All steps of sepsis protocol initiated except IV abx.  Consult critical care MD PCCM and Dr Royce Macadamia, anesthesiologist, and plan to wait 1-1.5 hours with epidural off, if no marked improvement, start IV abx and continue sepsis protocol.  Assessment / Plan: Augmentation of labor, progressing well  Labor: Labor progress slowed with Pitocin off during hypotensive episode.  With normal FHR at this time, Pitocin restarted at 2 milliunits/min. Increase per protocol.  Preeclampsia:  n/a Fetal Wellbeing:  Category I Pain Control:  Epidural off per anesthesiologist to address hypotension.  IV pain medicaiton PRN. I/D:  GBS neg Anticipated MOD:  NSVD  Fatima Blank 12/07/2018, 11:02 AM

## 2018-12-07 NOTE — Anesthesia Preprocedure Evaluation (Signed)
Anesthesia Evaluation  Patient identified by MRN, date of birth, ID band Patient awake    Reviewed: Allergy & Precautions, H&P , NPO status , Patient's Chart, lab work & pertinent test results  History of Anesthesia Complications Negative for: history of anesthetic complications  Airway Mallampati: II  TM Distance: >3 FB Neck ROM: full    Dental no notable dental hx. (+) Teeth Intact   Pulmonary former smoker,    Pulmonary exam normal breath sounds clear to auscultation       Cardiovascular negative cardio ROS Normal cardiovascular exam Rhythm:regular Rate:Normal     Neuro/Psych negative neurological ROS  negative psych ROS   GI/Hepatic negative GI ROS, Neg liver ROS,   Endo/Other  PCOS (polycystic ovarian syndrome)  Renal/GU negative Renal ROS  negative genitourinary   Musculoskeletal   Abdominal   Peds  Hematology  (+) Blood dyscrasia, anemia ,   Anesthesia Other Findings   Reproductive/Obstetrics (+) Pregnancy                             Anesthesia Physical Anesthesia Plan  ASA: II  Anesthesia Plan: Epidural   Post-op Pain Management:    Induction:   PONV Risk Score and Plan:   Airway Management Planned:   Additional Equipment:   Intra-op Plan:   Post-operative Plan:   Informed Consent: I have reviewed the patients History and Physical, chart, labs and discussed the procedure including the risks, benefits and alternatives for the proposed anesthesia with the patient or authorized representative who has indicated his/her understanding and acceptance.       Plan Discussed with:   Anesthesia Plan Comments:         Anesthesia Quick Evaluation

## 2018-12-07 NOTE — Anesthesia Procedure Notes (Signed)
Epidural Patient location during procedure: OB Start time: 12/07/2018 1:58 AM End time: 12/07/2018 2:08 AM  Staffing Anesthesiologist: Murvin Natal, MD Performed: anesthesiologist   Preanesthetic Checklist Completed: patient identified, site marked, pre-op evaluation, timeout performed, IV checked, risks and benefits discussed and monitors and equipment checked  Epidural Patient position: sitting Prep: DuraPrep Patient monitoring: heart rate, cardiac monitor, continuous pulse ox and blood pressure Approach: midline Location: L4-L5 Injection technique: LOR air  Needle:  Needle type: Tuohy  Needle gauge: 17 G Needle length: 9 cm Needle insertion depth: 5 cm Catheter type: closed end flexible Catheter size: 19 Gauge Catheter at skin depth: 10 cm Test dose: negative and Other  Assessment Events: blood not aspirated, injection not painful, no injection resistance and negative IV test  Additional Notes Informed consent obtained prior to proceeding including risk of failure, 1% risk of PDPH, risk of minor discomfort and bruising. Discussed alternatives to epidural analgesia and patient desires to proceed.  Timeout performed pre-procedure verifying patient name, procedure, and platelet count.  Patient tolerated procedure well. Reason for block:procedure for pain

## 2018-12-08 ENCOUNTER — Inpatient Hospital Stay (HOSPITAL_COMMUNITY): Payer: Medicaid Other

## 2018-12-08 ENCOUNTER — Encounter (HOSPITAL_COMMUNITY): Payer: Self-pay | Admitting: *Deleted

## 2018-12-08 DIAGNOSIS — R404 Transient alteration of awareness: Secondary | ICD-10-CM

## 2018-12-08 DIAGNOSIS — R4189 Other symptoms and signs involving cognitive functions and awareness: Secondary | ICD-10-CM

## 2018-12-08 DIAGNOSIS — Z3A39 39 weeks gestation of pregnancy: Secondary | ICD-10-CM

## 2018-12-08 LAB — URINE CULTURE: Culture: NO GROWTH

## 2018-12-08 MED ORDER — OXYCODONE HCL 5 MG PO TABS
5.0000 mg | ORAL_TABLET | Freq: Once | ORAL | Status: AC
  Administered 2018-12-08: 07:00:00 5 mg via ORAL
  Filled 2018-12-08: qty 1

## 2018-12-08 MED ORDER — ZOLPIDEM TARTRATE 5 MG PO TABS: 5.0000 mg | ORAL_TABLET | Freq: Every evening | ORAL | Status: DC | PRN

## 2018-12-08 MED ORDER — ONDANSETRON HCL 4 MG/2ML IJ SOLN: 4.0000 mg | INTRAMUSCULAR | Status: DC | PRN

## 2018-12-08 MED ORDER — IOHEXOL 300 MG/ML  SOLN
100.0000 mL | Freq: Once | INTRAMUSCULAR | Status: AC | PRN
  Administered 2018-12-08: 100 mL via INTRAVENOUS

## 2018-12-08 MED ORDER — PRENATAL MULTIVITAMIN CH
1.0000 | ORAL_TABLET | Freq: Every day | ORAL | Status: DC
  Administered 2018-12-08: 12:00:00 1 via ORAL
  Filled 2018-12-08: qty 1

## 2018-12-08 MED ORDER — BENZOCAINE-MENTHOL 20-0.5 % EX AERO: 1.0000 "application " | INHALATION_SPRAY | CUTANEOUS | Status: DC | PRN

## 2018-12-08 MED ORDER — CYCLOBENZAPRINE HCL 10 MG PO TABS
5.0000 mg | ORAL_TABLET | Freq: Three times a day (TID) | ORAL | Status: DC | PRN
  Administered 2018-12-08 – 2018-12-09 (×2): 5 mg via ORAL
  Filled 2018-12-08 (×2): qty 1

## 2018-12-08 MED ORDER — OXYCODONE HCL 5 MG PO TABS
5.0000 mg | ORAL_TABLET | ORAL | Status: DC | PRN
  Administered 2018-12-08: 5 mg via ORAL
  Filled 2018-12-08: qty 1

## 2018-12-08 MED ORDER — DIPHENHYDRAMINE HCL 25 MG PO CAPS: 25.0000 mg | ORAL_CAPSULE | Freq: Four times a day (QID) | ORAL | Status: DC | PRN

## 2018-12-08 MED ORDER — ACETAMINOPHEN 325 MG PO TABS
650.0000 mg | ORAL_TABLET | ORAL | Status: DC | PRN
  Administered 2018-12-08 (×2): 650 mg via ORAL
  Filled 2018-12-08 (×2): qty 2

## 2018-12-08 MED ORDER — WITCH HAZEL-GLYCERIN EX PADS: 1.0000 "application " | MEDICATED_PAD | CUTANEOUS | Status: DC | PRN

## 2018-12-08 MED ORDER — DIBUCAINE (PERIANAL) 1 % EX OINT: 1.0000 "application " | TOPICAL_OINTMENT | CUTANEOUS | Status: DC | PRN

## 2018-12-08 MED ORDER — SENNOSIDES-DOCUSATE SODIUM 8.6-50 MG PO TABS
2.0000 | ORAL_TABLET | ORAL | Status: DC
  Administered 2018-12-08: 2 via ORAL
  Filled 2018-12-08: qty 2

## 2018-12-08 MED ORDER — COCONUT OIL OIL: 1.0000 "application " | TOPICAL_OIL | Status: DC | PRN

## 2018-12-08 MED ORDER — SODIUM CHLORIDE 0.9 % IV SOLN
INTRAVENOUS | Status: DC
  Administered 2018-12-08: 05:00:00 via INTRAVENOUS

## 2018-12-08 MED ORDER — SIMETHICONE 80 MG PO CHEW: 80.0000 mg | CHEWABLE_TABLET | ORAL | Status: DC | PRN

## 2018-12-08 MED ORDER — ONDANSETRON HCL 4 MG PO TABS: 4.0000 mg | ORAL_TABLET | ORAL | Status: DC | PRN

## 2018-12-08 MED ORDER — IBUPROFEN 600 MG PO TABS
600.0000 mg | ORAL_TABLET | Freq: Four times a day (QID) | ORAL | Status: DC
  Administered 2018-12-08 – 2018-12-09 (×5): 600 mg via ORAL
  Filled 2018-12-08 (×5): qty 1

## 2018-12-08 MED ORDER — AMMONIA AROMATIC IN INHA
RESPIRATORY_TRACT | Status: AC
  Filled 2018-12-08: qty 10

## 2018-12-08 MED ORDER — OXYCODONE HCL 5 MG PO TABS
5.0000 mg | ORAL_TABLET | ORAL | Status: DC | PRN
  Administered 2018-12-08: 10 mg via ORAL
  Filled 2018-12-08: qty 2

## 2018-12-08 NOTE — Progress Notes (Signed)
Dr. Elly Modena notified of patient's lower back pain not relieved by oxycodone, ibuprofen or heat. At this time, MD does not wish to order anything else. Pt informed and claimed, "I will have someone bring my pain medicines from home because the pain medicines you are giving me are not working. It's been over an  hour and my pain has not gotten any better."  Pt in room crying. Pt informed and educated that she cannot take any medications from home and why. When asked what she takes at home, she states, "I have hydrocodone and extra strength tylenol at home." This information was relayed to charge nurse who is going to contact Dr. Elly Modena again.

## 2018-12-08 NOTE — Progress Notes (Signed)
EEG complete - results pending 

## 2018-12-08 NOTE — Progress Notes (Signed)
Post Partum Day 0  Subjective: Pt feeling anxious. Reports RIF pain and blurred vision. Denies headache and pain elsewhere.  Objective: Blood pressure 111/60, pulse 77, temperature 98.5 F (36.9 C), temperature source Axillary, resp. rate 16, height 5\' 1"  (1.549 m), weight 69.9 kg, last menstrual period 03/09/2018, SpO2 99 %.  During delivery of baby pt had recurrent bouts of LOC lasting approx 3-5 seconds with her eyes closed.  Responded to sternal rub and briefly to ammonia salts. Immediately after delivery, patient continued to have these same unresponsive episodes, lasting same amount of time. Would wake up gasping for breath, tearful and anxious. Reports RIF pain which she had had 2-3 days prior to admission. Denied SOB or chest pain.  BPs were labile and pt was tachycardic. Sats dropped to 88% but came up with supplemental oxygen. Pt was afebrile. Pt or partner deny previous episodes like this before. No seizure hx. No post-ictal state.   Physical Exam:  General: delirious, fatigued and moderate distress  Cardiac: RRR, no rubs or gallops Pulm: chest clear on ausc, no crackles or wheeze, no respiratory distress  GI: abdo soft, tender in RIF, no guarding. Bowel sounds present  Lochia: appropriate Uterine Fundus: firm Incision: n/a Neuro: PERLA, no ptosis or diplopia. No dysphasia. DVT Evaluation: No cords or calf tenderness. No significant calf/ankle edema.  CBG: 65 Recent Labs    12/07/18 1334 12/07/18 2201  HGB 10.7* 11.0*  HCT 32.1* 31.3*    Assessment/Plan: 23 yr old with recurrent episodes of LOC during labor and post-partum. Likely psychogenic cause. Unlikely to be organic cause. CXR, CBC, CMET, LA this morning which did not show gross abnormalities.  1) Urgent Neuro consult, appreciate recs 2) CT head and CT abdomen pelvis  3) Close monitoring of vital signs  4) Repeat CBC, CMET.   LOS: 1 day   Lattie Haw MD PGY-1, [facility] 12/08/2018, 12:12  AM

## 2018-12-08 NOTE — Anesthesia Postprocedure Evaluation (Signed)
Anesthesia Post Note  Patient: Avalee Castrellon  Procedure(s) Performed: AN AD HOC LABOR EPIDURAL     Patient location during evaluation: Mother Baby Anesthesia Type: Epidural Level of consciousness: awake Pain management: satisfactory to patient Vital Signs Assessment: post-procedure vital signs reviewed and stable Respiratory status: spontaneous breathing Cardiovascular status: stable Anesthetic complications: no    Last Vitals:  Vitals:   12/08/18 1140 12/08/18 1708  BP: 126/83 114/75  Pulse: 78 68  Resp: 18 18  Temp: 36.6 C 36.8 C  SpO2: 100% 97%    Last Pain:  Vitals:   12/08/18 1708  TempSrc: Oral  PainSc:    Pain Goal: Patients Stated Pain Goal: 3 (12/08/18 7681)                 Casimer Lanius

## 2018-12-08 NOTE — Lactation Note (Addendum)
This note was copied from a baby's chart. Lactation Consultation Note  Patient Name: Madison Stone OZHYQ'M Date: 12/08/2018 Reason for consult: Initial assessment;Term  P4 mother whose infant is now 36 hours old.  This is a term baby.  Mother breast fed her other three children from birth until she found out she was pregnant with the subsequent child.  Baby was doing STS when I arrived.  RN in the room and the EEG tech followed me in the room to do a procedure with mother.  Mother reminded me that this was her fourth child and she had no immediate questions/concerns related to breast feeding.  Encouraged to feed 8-12 times/24 hours or sooner if baby shows feeding cues.  Mother politely acknowledged.    Mother is not a [facility] participant but plans to apply to [facility].  She does not have private insurance.  She will be a "stay at home" mother.  [employer] referral faxed to obtain a DEBP if possible after discharge.  Mother informed that lactation services will be available throughout the night if she needs any assistance.     Maternal Data Formula Feeding for Exclusion: No Has patient been taught Hand Expression?: Yes Does the patient have breastfeeding experience prior to this delivery?: Yes  Feeding Feeding Type: Breast Fed  LATCH Score                   Interventions    Lactation Tools Discussed/Used WIC Program: No(Mother plans to apply)   Consult Status Consult Status: Follow-up Date: 12/09/18 Follow-up type: In-patient    Little Ishikawa 12/08/2018, 4:19 PM

## 2018-12-08 NOTE — Lactation Note (Signed)
This note was copied from a baby's chart. Lactation Consultation Note  Patient Name: Madison Stone OFBPZ'W Date: 12/08/2018 Reason for consult: Initial assessment  LC Initial Visit:  Attempted to visit with mother, however, she is in the shower.  Tech giving baby a bath at the present time.  I will return later this afternoon.   Maternal Data    Feeding Feeding Type: Breast Fed  LATCH Score                   Interventions    Lactation Tools Discussed/Used     Consult Status Consult Status: Follow-up Date: 12/08/18 Follow-up type: In-patient    Lena Fieldhouse R Kadijah Shamoon 12/08/2018, 3:22 PM

## 2018-12-08 NOTE — Procedures (Signed)
Patient Name: Madison Stone  MRN: [id number]  Epilepsy Attending: Lora Havens  Referring Physician/Provider: Dr Roland Rack Date: 12/08/2018 Duration: 23.24 mins  Patient history: 23yo F with term delivery and now episodes of brief unresponsiveness. EEG to evaluate for seizure   Level of alertness: awake, asleep  AEDs during EEG study: None  Technical aspects: This EEG study was done with scalp electrodes positioned according to the 10-20 International system of electrode placement. Electrical activity was acquired at a sampling rate of 500Hz  and reviewed with a high frequency filter of 70Hz  and a low frequency filter of 1Hz . EEG data were recorded continuously and digitally stored.  DESCRIPTION: The posterior dominant rhythm consists of 10-11 Hz activity of moderate voltage (25-35 uV) seen predominantly in posterior head region. Sleep was characterized by vertex waves, sleep spindles ( 12-14Hz ), maximal frontocentral.  Hyperventilation and photic stimulation were not performed.  IMPRESSION: This study is within normal limits. No seizures or epileptiform discharges were seen throughout the recording.  Harlow Basley Barbra Sarks

## 2018-12-08 NOTE — Progress Notes (Signed)
CSW acknowledged consult and attempted to see MOB, however MOB was asleep. CSW will attempt to see MOB at a later time.   Madison Stone, [place] Worker [facility] Cell#: [phone]

## 2018-12-08 NOTE — Progress Notes (Signed)
Pt c/o severe pain around epidural insertion site.  She describes it as burning, cramping, "like I worked out too hard".  She says lying in bed or in a warm shower she is fine, but that walking around creates the pain.   On assessment, RN does not see any redness, swelling, discoloration or drainage at the insertion site or anywhere on the patient's back.   Phoned Dr. Doroteo Glassman, anesthesiologist, with above information.  She stated she will come see the patient.
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
## 2018-12-08 NOTE — Progress Notes (Signed)
Post Partum Day #1 TSVD Subjective: Pt sitting up in bed watching movie on computer. She has no complaints this morning. Pain controlled with pain medication. Tolerating diet. Breast feeding without problems.   Objective: Blood pressure 111/71, pulse 64, temperature 98.2 F (36.8 C), temperature source Oral, resp. rate 18, height 5\' 1"  (1.549 m), weight 69.9 kg, last menstrual period 03/09/2018, SpO2 98 %, unknown if currently breastfeeding.  Physical Exam:  General: alert Lochia: appropriate Uterine Fundus: firm Incision: NA DVT Evaluation: No evidence of DVT seen on physical exam.  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 Sepsis  Events of yesterday and delviery noted. Much improved today. Appreciate neurology's help. W/U thus far negative. EEG this afternoon. BC/UC/CT scans negative. Will D/C foley and IV antibiotics after 24 hrs. If remain stable plan of discharge home tomorrow.      LOS: 1 day   Chancy Milroy 12/08/2018, 11:39 AM

## 2018-12-08 NOTE — Progress Notes (Addendum)
NEUROLOGY PROGRESS NOTE  Subjective: Patient is resting comfortably in room, she is playing a game on her iPhone.  Patient states that she had a "couple episodes per her husband which she blacked out last night and woke up not knowing that she blacked out".  Exam: Vitals:   12/08/18 0423 12/08/18 0520  BP: (!) 130/91 118/78  Pulse: 67 68  Resp: 18 18  Temp: 97.6 F (36.4 C) 98.3 F (36.8 C)  SpO2: 100% 99%    Physical Exam   HEENT-  Normocephalic, no lesions, without obvious abnormality.  Normal external eye and conjunctiva.   Extremities- Warm, dry and intact Musculoskeletal-no joint tenderness, deformity or swelling Skin-warm and dry, no hyperpigmentation, vitiligo, or suspicious lesions  Neuro:  Mental Status: Alert, oriented, thought content appropriate.  Speech fluent without evidence of aphasia.  Able to follow 3 step commands without difficulty. Cranial Nerves: II:  Visual fields grossly normal,  III,IV, VI: ptosis not present, extra-ocular motions intact bilaterally pupils equal, round, reactive to light and accommodation V,VII: smile symmetric, facial light touch sensation normal bilaterally VIII: hearing normal bilaterally IX,X: Palate rises midline XI: bilateral shoulder shrug XII: midline tongue extension Motor: Right : Upper extremity   5/5    Left:     Upper extremity   5/5  Lower extremity   5/5     Lower extremity   5/5 Tone and bulk:normal tone throughout; no atrophy noted Sensory: Pinprick and light touch intact throughout, bilaterally   Medications:  Scheduled: . ammonia      . ammonia      . ibuprofen  600 mg Oral Q6H  . prenatal multivitamin  1 tablet Oral Q1200  . [START ON 12/09/2018] senna-docusate  2 tablet Oral Q24H   Continuous: . sodium chloride 125 mL/hr at 12/08/18 0507  . cefoTEtan (CEFOTAN) IV 2 g (12/07/18 1422)  . clindamycin (CLEOCIN) IV 900 mg (12/08/18 0510)   ATF:TDDUKGURKYHCW, benzocaine-Menthol, coconut oil, witch  hazel-glycerin **AND** dibucaine, diphenhydrAMINE, ondansetron **OR** ondansetron (ZOFRAN) IV, oxyCODONE, simethicone, zolpidem  Pertinent Labs/Diagnostics: Sodium 131 BUN less than 5 Creatinine 0.49 WBC 12.2   Ct Head Wo Contrast Result Date: 12/08/2018 CLINICAL DATA:  Initial evaluation for acute altered mental status, unexplained right-sided numbness with episode of non responsiveness. EXAM: CT HEAD WITHOUT CONTRAST TECHNIQUE: Contiguous axial images were obtained from the base of the skull through the vertex without intravenous contrast. COMPARISON:  None available. FINDINGS: Brain: Cerebral volume within normal limits for patient age. No evidence for acute intracranial hemorrhage. No findings to suggest acute large vessel territory infarct. No mass lesion, midline shift, or mass effect. Ventricles are normal in size without evidence for hydrocephalus. No extra-axial fluid collection identified. Vascular: No hyperdense vessel identified. Skull: Scalp soft tissues demonstrate no acute abnormality. Calvarium intact. Sinuses/Orbits: Globes and orbital soft tissues within normal limits. Visualized paranasal sinuses are clear. No mastoid effusion. IMPRESSION: Negative head CT.  No acute intracranial abnormality identified. Electronically Signed   By: Rise Mu M.D.   On: 12/08/2018 01:12   EEG-pending   Felicie Morn PA-C [facility] [phone]   Assessment: 23 year old female with recurrent episodes of unresponsiveness.   1. Patient had response to ammonia salts which would point/argue for possible nonorganic pathology.   2. No clinical findings to suggest any hypoperfusion events.   3. CT head did not show any abnormalities.   4. Awaiting EEG.  Recommendations: -- Awaiting EEG results.  -- If EEG is positive, please call the Neurology service for  further recommendations.    Electronically signed: Dr. Kerney Elbe 12/08/2018, 9:01 AM
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
## 2018-12-08 NOTE — Progress Notes (Signed)
Called by RN to see patient c/o tenderness at old epidural insertion site. Epidural site completely healed with very small scab at the site. Tender to light palpation throughout the paraspinal musculature bilaterally in the lumbar and lower thoracic region. No erythema or edema at the site.   She states she takes a muscle relaxant at home for headaches and would be willing to try a muscle relaxant for this lower back pain she is having. I communicated this with her primary team.

## 2018-12-08 NOTE — Plan of Care (Signed)
  Problem: Activity: Goal: Ability to tolerate increased activity will improve Outcome: Progressing   Problem: Life Cycle: Goal: Chance of risk for complications during the postpartum period will decrease Outcome: Progressing

## 2018-12-08 NOTE — Plan of Care (Signed)
Perineum intact.Has been holding infant but is asked to leave baby in crib when alone or daddy is asleep.

## 2018-12-09 ENCOUNTER — Encounter: Payer: Medicaid Other | Admitting: Advanced Practice Midwife

## 2018-12-09 ENCOUNTER — Ambulatory Visit: Payer: Self-pay

## 2018-12-09 MED ORDER — OXYCODONE HCL 5 MG PO TABS: 5.0000 mg | ORAL_TABLET | ORAL | 0 refills | Status: AC | PRN

## 2018-12-09 MED ORDER — IBUPROFEN 600 MG PO TABS: 600.0000 mg | ORAL_TABLET | Freq: Four times a day (QID) | ORAL | 0 refills | Status: AC

## 2018-12-09 MED ORDER — CYCLOBENZAPRINE HCL 5 MG PO TABS: 5.0000 mg | ORAL_TABLET | Freq: Three times a day (TID) | ORAL | 0 refills | Status: AC | PRN

## 2018-12-09 NOTE — Progress Notes (Signed)
EEG was within normal limits.   Neurology will sign off. Please call if there are additional questions.   Electronically signed: Dr. Kerney Elbe

## 2018-12-09 NOTE — Progress Notes (Signed)
CSW informed that MOB reported that she took muscle relaxers, pain medication and sleeping pills during pregnancy, provider unable to find a prescription.   CSW made a [facility] CPS report due to infant's positive UDS for THC. CSW also informed intake worker that MOB took the medications listed above and provider unable to find a prescription. Currently there are no barriers to infant discharging home with MOB.   Abundio Miu, [place] Worker [facility] Cell#: [phone]

## 2018-12-09 NOTE — Discharge Instructions (Signed)

## 2018-12-09 NOTE — Lactation Note (Signed)
This note was copied from a baby's chart. Lactation Consultation Note  Patient Name: Madison Stone Date: 12/09/2018 Reason for consult: Follow-up assessment;Term  P4 mother whose infant is now 66 hours old.  This is a term baby.  Mother breast fed her other three children from birth until she found out she was pregnant with the subsequent child.  Mother was hoping to be discharged today and was unhappy about having to stay another day.  Baby is at a 7% weight loss but mother said, "All my babies start out like this."    I explained the reasons why baby was staying another day and mother felt somewhat better.  Baby was double wrapped when I arrived and sleeping in the bassinet.  Mother stated she had not fed since about 0800 this morning.  Offered to assist with latching and mother accepted.    Mother's breasts are soft and non tender and nipples are everted and intact.  Mother wanted to feed in the cradle position.  Asked her to demonstrate hand expression and she was able to obtain colostrum drops.  Observed her latching baby in the cradle position and there were many issues with technique that I suggested could be improved.  Since baby remained quite sleepy I suggested the football hold and received mother's permission to try this hold.    Awakened baby more and reminded mother to feed STS.  Positioned her appropriately and assisted baby to latch in the football hold on the right breast without difficulty.  Demonstrated breast compressions.  Instructed mother on finger placement and how to support her breast.  Mother was able to observe gentle stimulation which kept baby actively feeding.  I observed her feeding for 10 minutes prior to my departure.  Asked mother to be aware of the length of time baby is at the breast and to be sure she is not sleeping and doing non nutritive sucking.  Mother appreciative of help and verbalized understanding.  FOB has purchased a DEBP for home use.   Encouraged mother to feed with cues and at least 8-12 times/24 hours.  Mother will supplement with formula as desired per her choice.  Volume guidelines discussed.  She will call for latch assistance as needed.   Maternal Data Formula Feeding for Exclusion: No Has patient been taught Hand Expression?: Yes Does the patient have breastfeeding experience prior to this delivery?: Yes  Feeding Feeding Type: Breast Fed  LATCH Score Latch: Grasps breast easily, tongue down, lips flanged, rhythmical sucking.  Audible Swallowing: A few with stimulation  Type of Nipple: Everted at rest and after stimulation  Comfort (Breast/Nipple): Soft / non-tender  Hold (Positioning): Assistance needed to correctly position infant at breast and maintain latch.  LATCH Score: 8  Interventions Interventions: Breast feeding basics reviewed;Skin to skin;Assisted with latch;Breast massage;Hand express;Breast compression;Adjust position;Position options;Support pillows  Lactation Tools Discussed/Used WIC Program: No(Mother plans to apply)   Consult Status Consult Status: Follow-up Date: 12/10/18 Follow-up type: In-patient    Little Ishikawa 12/09/2018, 12:46 PM

## 2018-12-09 NOTE — Progress Notes (Signed)
Patient discharged with printed instructions. Pt verbalized an understanding. No concerns noted. Aasha Dina L Daziyah Cogan, RN 

## 2018-12-10 ENCOUNTER — Ambulatory Visit: Payer: Self-pay

## 2018-12-10 LAB — SURGICAL PATHOLOGY

## 2018-12-10 NOTE — Lactation Note (Signed)
This note was copied from a baby's chart. Lactation Consultation Note  Patient Name: Madison Stone [id number] Date: 12/10/2018 Reason for consult: Follow-up assessment;Infant < 6lbs;Infant weight loss;Term  LC in to visit with P4 Mom of term baby at 21 hrs old.  15 birth weight <6 lbs and baby at 9% weight loss.  Output good, stools transitional.  Baby has been exclusively breastfeeding with occasional formula supplementation. Total of 35 ml last 24 hrs given to baby.  Mom states baby doesn't like the bottle.    Mom has a DEBP kit in room, but it wasn't opened.  Talked about adding some pumping to support a full milk supply, and to offer baby her EBM after breastfeeding to help stabilize baby's weight.  Mom is eager and happy to start pumping.   Assisted with Mom positioning and latching baby.  Mom's breast are very soft and compressible.  EBM easily expressed by hand.  Baby latches, but shallow and becomes non-nutritive.  Re-latched baby with assistance and baby became more nutritive with a deeper latch. Baby had a stool, changed diaper and Mom latched baby on the second breast and baby more alert and able to latch deeper and regular swallows.  Assisted Mom to double pump on initiation setting, due to 9% weight loss and sleepy baby on the breast.  Mom expressed 20 ml in 10 mins and assisted Mom to pace bottle feed baby.  Baby needing some help taking the milk from the bottle, twirling the nipple worked and baby grabbed it and fed well.  Plan- 1- Keep baby STS as much as possible 2- Offer breast with cues, or awaken baby at 3 hrs if she is sleeping. 3- pump both breasts 15-20 mins  4- Feed baby EBM by paced bottle  Mom states she has WIC in [facility]  Mom made aware of [facility] loaner pump she can obtain from [place].  Paperwork given and instructed on $30 cash she needs as a deposit for 12 days. Mom is interested and will let her nurse know when paperwork is done and she has the $30, LC  will bring her pump to her.   Talked about OP Lactation follow-up, recommended she come back to see Lactation.   Baby will be re-weighed at 4pm to decide if baby will be discharged today.  Feeding Feeding Type: Breast Milk Nipple Type: Slow - flow  LATCH Score Latch: Grasps breast easily, tongue down, lips flanged, rhythmical sucking.  Audible Swallowing: Spontaneous and intermittent  Type of Nipple: Everted at rest and after stimulation  Comfort (Breast/Nipple): Soft / non-tender  Hold (Positioning): No assistance needed to correctly position infant at breast.  LATCH Score: 10  Interventions Interventions: Breast feeding basics reviewed;Assisted with latch;Skin to skin;Breast massage;Hand express;Breast compression;Adjust position;Support pillows;Position options;Expressed milk;DEBP  Lactation Tools Discussed/Used WIC Program: Yes Pump Review: Setup, frequency, and cleaning;Milk Storage Initiated by:: Cipriano Mile RN IBCLC Date initiated:: 12/10/18   Consult Status Consult Status: Follow-up Date: 12/10/18 Follow-up type: [place] 12/10/2018, 12:42 PM

## 2018-12-12 LAB — BLOOD CULTURE ID PANEL (REFLEXED)

## 2018-12-12 LAB — CULTURE, BLOOD (ROUTINE X 2)
Culture: NO GROWTH
Special Requests: ADEQUATE

## 2018-12-13 LAB — CULTURE, BLOOD (ROUTINE X 2): Special Requests: ADEQUATE

## 2019-01-13 ENCOUNTER — Encounter: Payer: Self-pay | Admitting: Advanced Practice Midwife

## 2019-01-13 ENCOUNTER — Ambulatory Visit: Payer: Medicaid Other | Admitting: Advanced Practice Midwife

## 2019-01-23 ENCOUNTER — Ambulatory Visit: Payer: Medicaid Other | Admitting: Family Medicine

## 2019-07-15 IMAGING — US US PELV - US TRANSVAGINAL
1 series · 13 of 25 positions shown · non-contrast
Comparison: None.

CLINICAL DATA: Heavy vaginal bleeding for 5 days

EXAM:
TRANSABDOMINAL AND TRANSVAGINAL ULTRASOUND OF PELVIS
DOPPLER ULTRASOUND OF OVARIES
TECHNIQUE: Both transabdominal and transvaginal ultrasound examinations of the
pelvis were performed. Transabdominal technique was performed for
global imaging of the pelvis including uterus, ovaries, adnexal
regions, and pelvic cul-de-sac.
It was necessary to proceed with endovaginal exam following the
transabdominal exam to visualize the uterus endometrium and ovaries.
Color and duplex Doppler ultrasound was utilized to evaluate blood
flow to the ovaries.

[Series 1: us pelv - us transvaginal · 0.22mm/px · 13 of 38 slices shown]
[im 1/38]
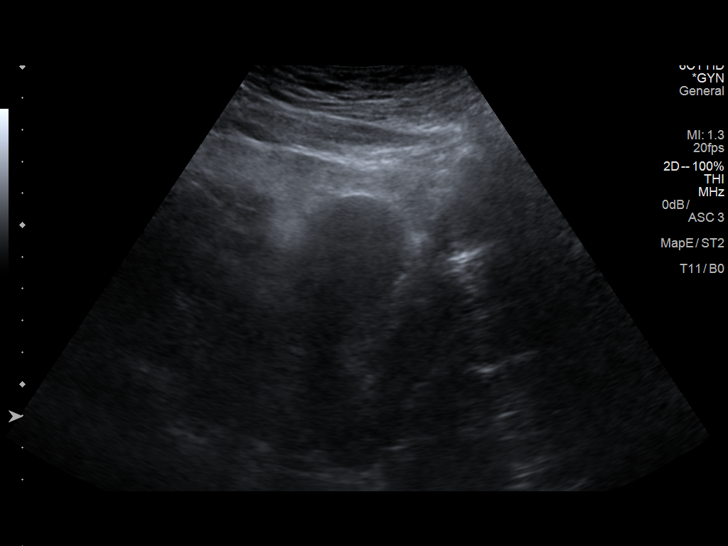
[im 4/38]
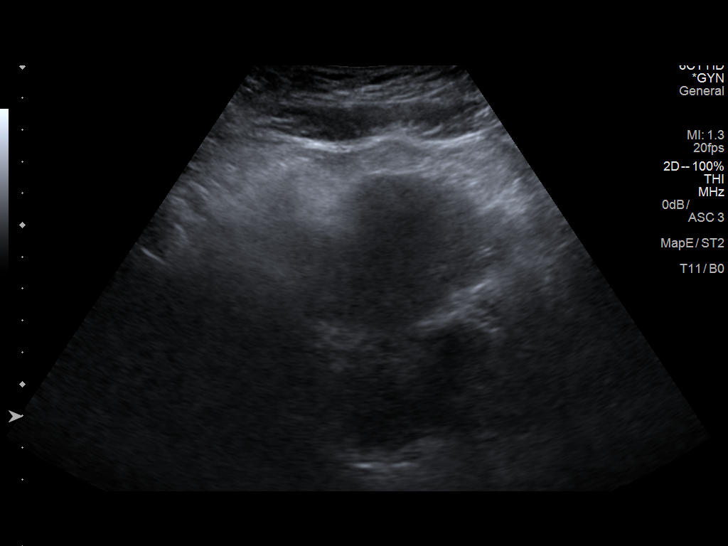
[im 7/38]
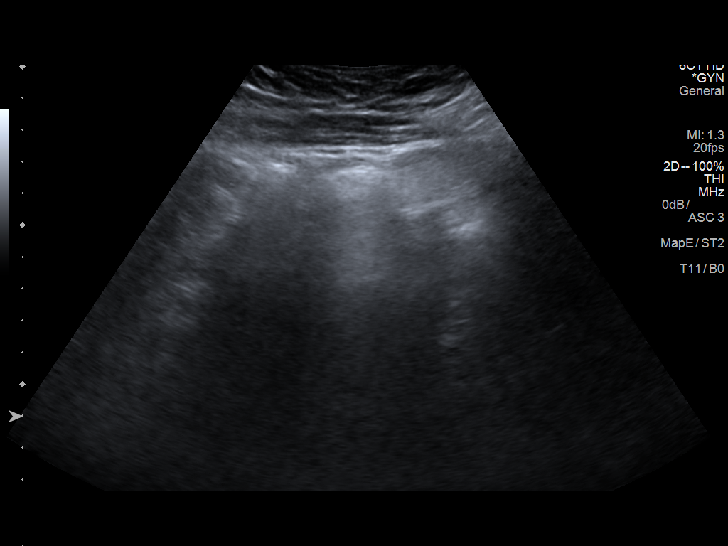
[im 10/38]
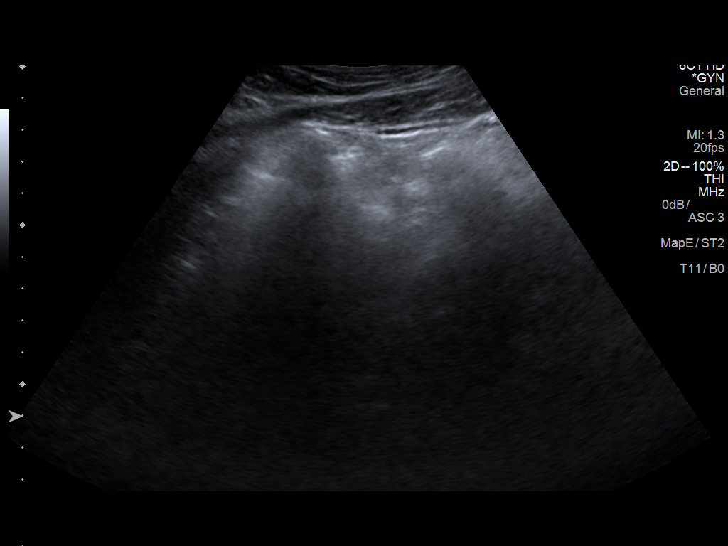
[im 13/38]
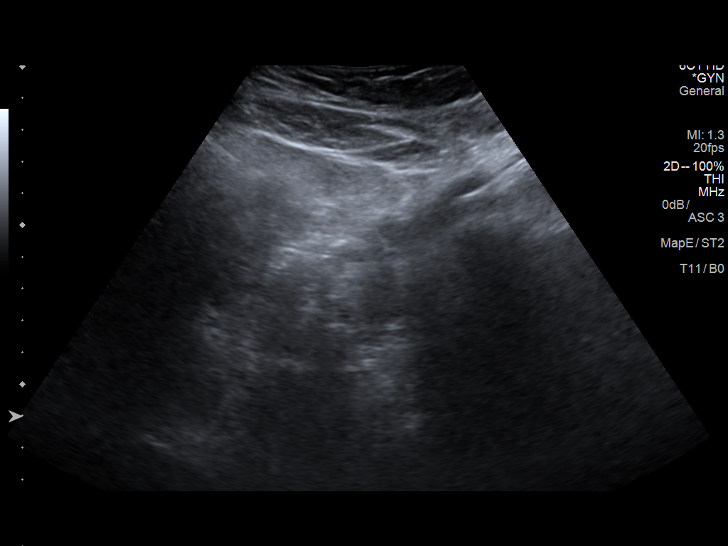
[im 16/38]
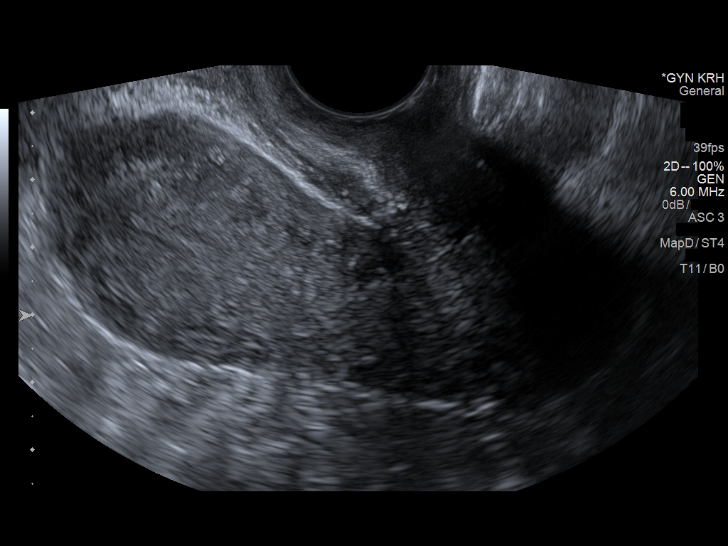
[im 19/38]
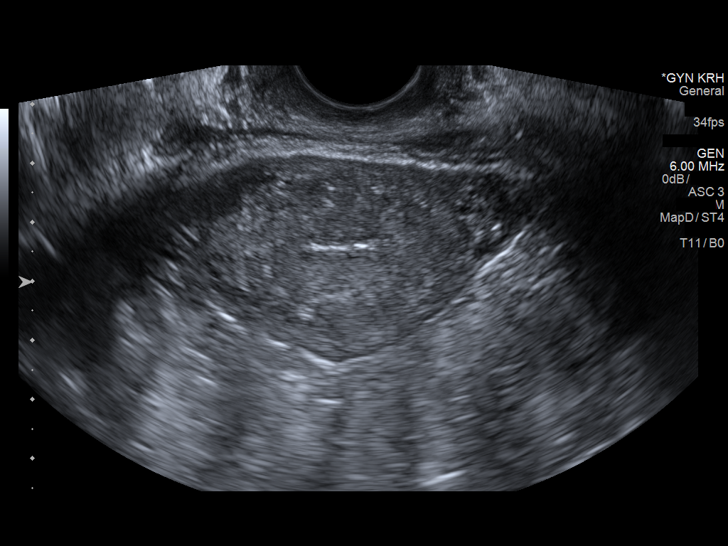
[im 22/38]
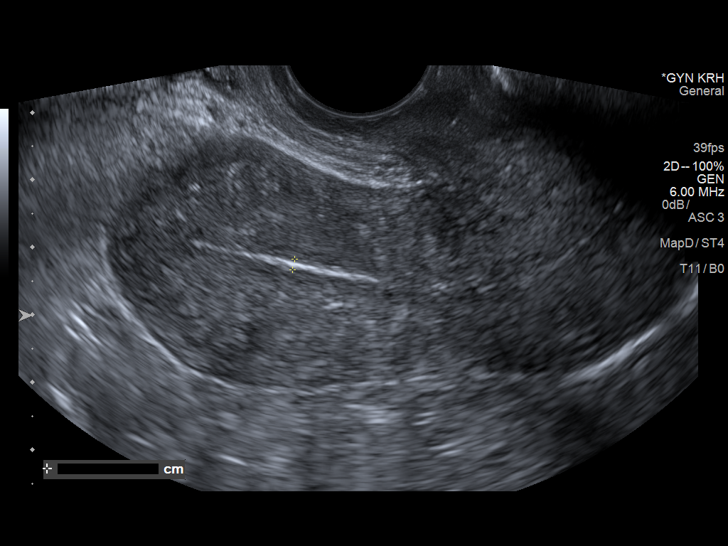
[im 25/38]
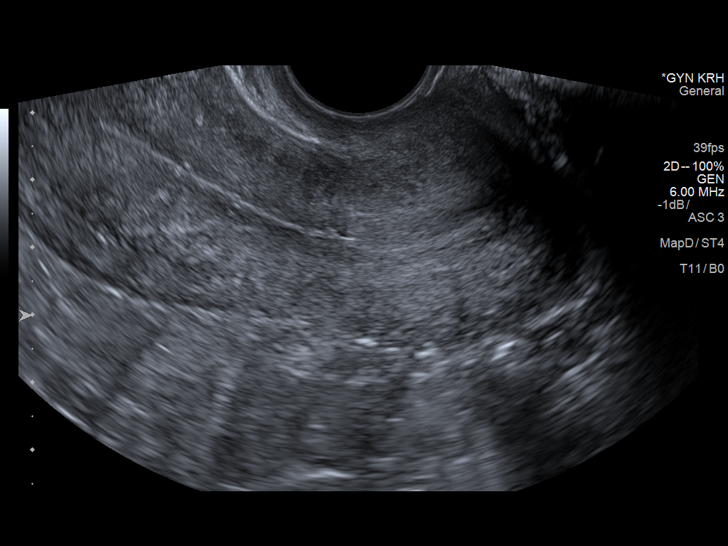
[im 28/38]
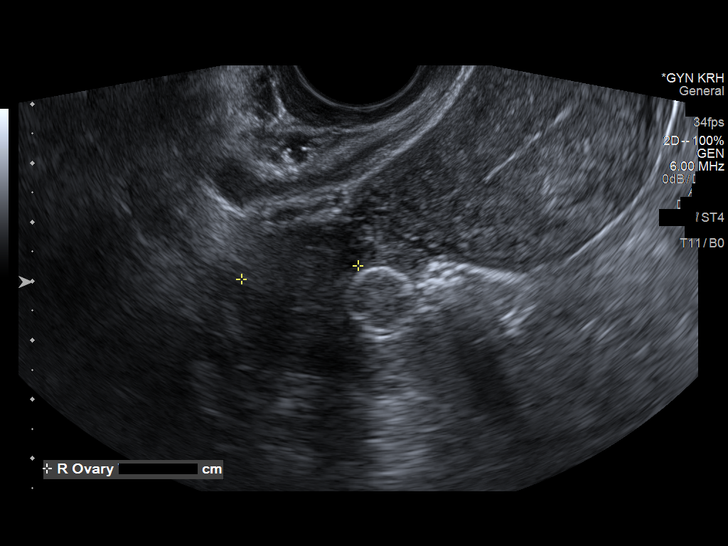
[im 31/38]
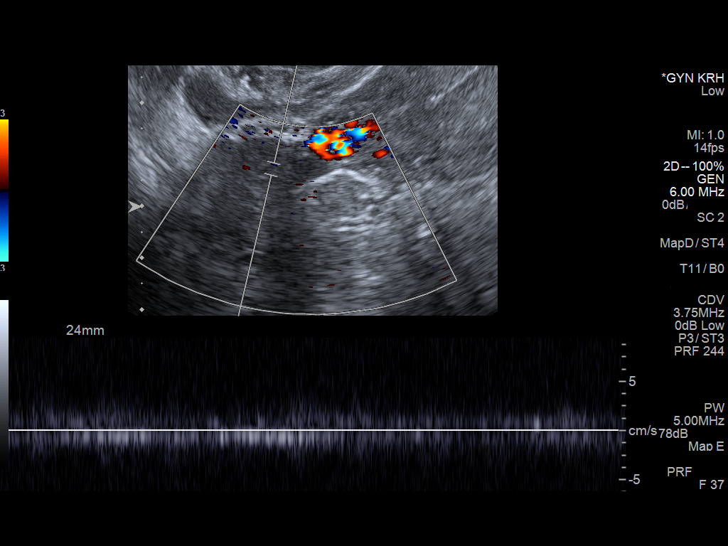
[im 34/38]
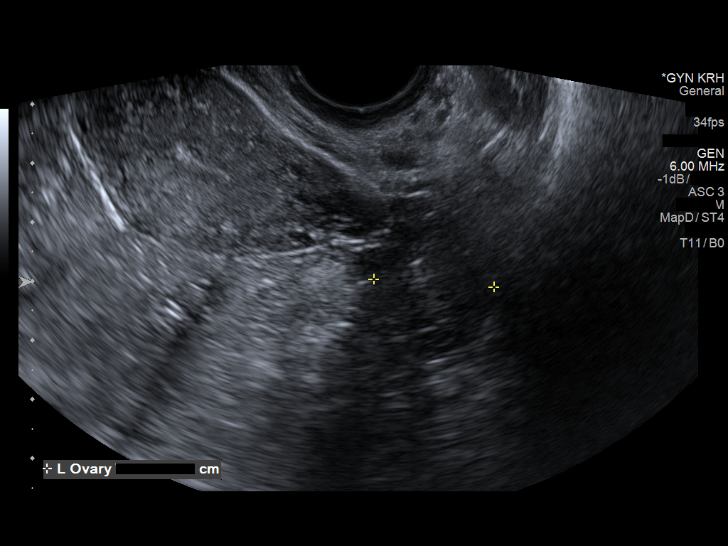
[im 38/38]
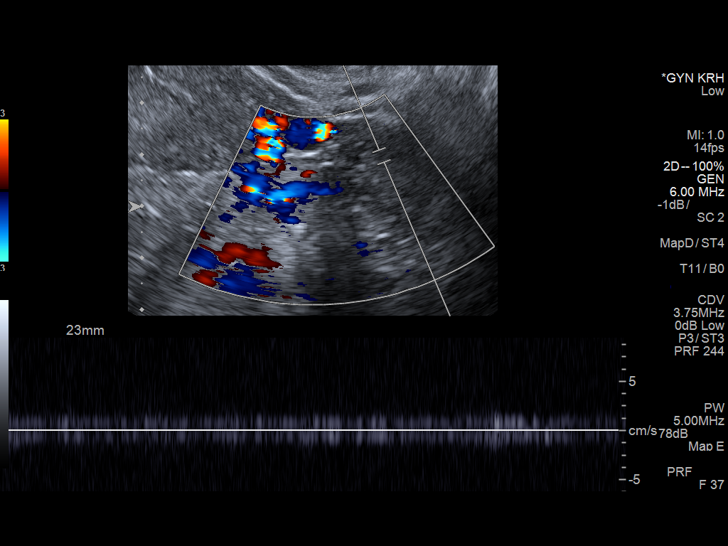

[13 of 25 positions shown; findings below may reference images not displayed]

FINDINGS: Uterus

Measurements: 9 x 3.8 x 5.5 cm. No fibroids or other mass
visualized.

Endometrium

Thickness: 2 mm.  No focal abnormality visualized.

Right ovary

Measurements: 3.1 x 1.8 x 2 cm.  Normal appearance/no adnexal mass.

Left ovary

Measurements: 2.5 x 2 x 2.2 cm.  Normal appearance/no adnexal mass.

Pulsed Doppler evaluation of both ovaries demonstrates normal
low-resistance arterial and venous waveforms.

Other findings

No abnormal free fluid.
IMPRESSION: 1. Endometrial thickness of 2 mm. If bleeding remains unresponsive
to hormonal or medical therapy, sonohysterogram should be considered
for focal lesion work-up. (Ref: Radiological Reasoning: Algorithmic
Workup of Abnormal Vaginal Bleeding with Endovaginal Sonography and
Sonohysterography. AJR [id number]; 191:[id number])
2. Negative for ovarian torsion

## 2019-12-29 ENCOUNTER — Emergency Department (HOSPITAL_BASED_OUTPATIENT_CLINIC_OR_DEPARTMENT_OTHER): Payer: Medicaid Other

## 2019-12-29 ENCOUNTER — Emergency Department (HOSPITAL_BASED_OUTPATIENT_CLINIC_OR_DEPARTMENT_OTHER)
Admission: EM | Admit: 2019-12-29 | Discharge: 2019-12-29 | Discharge status: Against Medical Advice | Payer: Medicaid Other | Attending: Emergency Medicine | Admitting: Emergency Medicine

## 2019-12-29 ENCOUNTER — Encounter (HOSPITAL_BASED_OUTPATIENT_CLINIC_OR_DEPARTMENT_OTHER): Payer: Self-pay | Admitting: Emergency Medicine

## 2019-12-29 ENCOUNTER — Other Ambulatory Visit: Payer: Self-pay

## 2019-12-29 DIAGNOSIS — Z91040 Latex allergy status: Secondary | ICD-10-CM | POA: Insufficient documentation

## 2019-12-29 DIAGNOSIS — Z87891 Personal history of nicotine dependence: Secondary | ICD-10-CM | POA: Diagnosis not present

## 2019-12-29 DIAGNOSIS — R0602 Shortness of breath: Secondary | ICD-10-CM | POA: Diagnosis present

## 2019-12-29 DIAGNOSIS — R202 Paresthesia of skin: Secondary | ICD-10-CM | POA: Diagnosis not present

## 2019-12-29 DIAGNOSIS — U071 COVID-19: Secondary | ICD-10-CM | POA: Diagnosis not present

## 2019-12-29 LAB — COMPREHENSIVE METABOLIC PANEL
ALT: 18 U/L (ref 0–44)
AST: 20 U/L (ref 15–41)
Albumin: 4 g/dL (ref 3.5–5.0)
Alkaline Phosphatase: 47 U/L (ref 38–126)
Anion gap: 9 (ref 5–15)
BUN: 5 mg/dL — ABNORMAL LOW (ref 6–20)
CO2: 22 mmol/L (ref 22–32)
Calcium: 8.6 mg/dL — ABNORMAL LOW (ref 8.9–10.3)
Chloride: 107 mmol/L (ref 98–111)
Creatinine, Ser: 0.61 mg/dL (ref 0.44–1.00)
GFR, Estimated: >60 mL/min (ref >60)
Glucose, Bld: 88 mg/dL (ref 70–99)
Potassium: 3.7 mmol/L (ref 3.5–5.1)
Sodium: 138 mmol/L (ref 135–145)
Total Bilirubin: 0.4 mg/dL (ref 0.3–1.2)
Total Protein: 7.1 g/dL (ref 6.5–8.1)

## 2019-12-29 LAB — URINALYSIS, ROUTINE W REFLEX MICROSCOPIC
Bilirubin Urine: NEGATIVE
Glucose, UA: NEGATIVE mg/dL
Hgb urine dipstick: NEGATIVE
Ketones, ur: 15 mg/dL — AB
Leukocytes,Ua: NEGATIVE
Nitrite: NEGATIVE
Protein, ur: NEGATIVE mg/dL
Specific Gravity, Urine: 1.025 (ref 1.005–1.030)
pH: 6 (ref 5.0–8.0)

## 2019-12-29 LAB — CBC WITH DIFFERENTIAL/PLATELET
Abs Immature Granulocytes: 0.01 10*3/uL (ref 0.00–0.07)
Basophils Absolute: 0 10*3/uL (ref 0.0–0.1)
Basophils Relative: 0 %
Eosinophils Absolute: 0.1 10*3/uL (ref 0.0–0.5)
Eosinophils Relative: 2 %
HCT: 42.4 % (ref 36.0–46.0)
Hemoglobin: 14.6 g/dL (ref 12.0–15.0)
Immature Granulocytes: 0 %
Lymphocytes Relative: 78 %
Lymphs Abs: 2.9 10*3/uL (ref 0.7–4.0)
MCH: 30.4 pg (ref 26.0–34.0)
MCHC: 34.4 g/dL (ref 30.0–36.0)
MCV: 88.1 fL (ref 80.0–100.0)
Monocytes Absolute: 0.3 10*3/uL (ref 0.1–1.0)
Monocytes Relative: 8 %
Neutro Abs: 0.5 10*3/uL — ABNORMAL LOW (ref 1.7–7.7)
Neutrophils Relative %: 12 %
Platelets: 272 10*3/uL (ref 150–400)
RBC: 4.81 MIL/uL (ref 3.87–5.11)
RDW: 12.2 % (ref 11.5–15.5)
Smear Review: NORMAL
WBC: 3.7 10*3/uL — ABNORMAL LOW (ref 4.0–10.5)
nRBC: 0 % (ref 0.0–0.2)

## 2019-12-29 LAB — RESPIRATORY PANEL BY RT PCR (FLU A&B, COVID)
Influenza A by PCR: NEGATIVE
Influenza B by PCR: NEGATIVE
SARS Coronavirus 2 by RT PCR: POSITIVE — AB

## 2019-12-29 LAB — TROPONIN I (HIGH SENSITIVITY): Troponin I (High Sensitivity): <2 ng/L (ref <18)

## 2019-12-29 LAB — D-DIMER, QUANTITATIVE: D-Dimer, Quant: 0.29 ug/mL-FEU (ref 0.00–0.50)

## 2019-12-29 LAB — CBG MONITORING, ED: Glucose-Capillary: 96 mg/dL (ref 70–99)

## 2019-12-29 LAB — PREGNANCY, URINE: Preg Test, Ur: NEGATIVE

## 2019-12-29 MED ORDER — SODIUM CHLORIDE 0.9 % IV BOLUS
500.0000 mL | Freq: Once | INTRAVENOUS | Status: AC
  Administered 2019-12-29: 500 mL via INTRAVENOUS

## 2019-12-29 MED ORDER — KETOROLAC TROMETHAMINE 15 MG/ML IJ SOLN
15.0000 mg | Freq: Once | INTRAMUSCULAR | Status: AC
  Administered 2019-12-29: 15 mg via INTRAVENOUS
  Filled 2019-12-29: qty 1

## 2019-12-29 MED ORDER — ONDANSETRON HCL 4 MG/2ML IJ SOLN
4.0000 mg | Freq: Once | INTRAMUSCULAR | Status: AC
  Administered 2019-12-29: 4 mg via INTRAVENOUS
  Filled 2019-12-29: qty 2

## 2019-12-29 NOTE — Discharge Instructions (Addendum)
You have chosen to leave AGAINST MEDICAL ADVICE. Should you change your mind, you are always welcome and encouraged to return to the ED. You are encouraged to follow-up with, at the very least, a primary care provider, or other similar medical professional on this matter. Specifically our recommendation was for you to stay in the ER to have an MRI of your brain and C-spine to make sure that you did not have a stroke.  You did test positive for COVID-19 today. I strongly recommend using Tylenol, ibuprofen and monitoring symptoms. It seems that the worst of her symptoms of past at this point.  I have given you a neurologist referral to follow-up with in case you begin to have more numbness and tingling in your arms.   Please use Tylenol or ibuprofen for pain.  You may use 600 mg ibuprofen every 6 hours or 1000 mg of Tylenol every 6 hours.  You may choose to alternate between the 2.  This would be most effective.  Not to exceed 4 g of Tylenol within 24 hours.  Not to exceed 3200 mg ibuprofen 24 hours.

## 2019-12-29 NOTE — ED Notes (Addendum)
Pt provided with information to transfer to [facility] for MRI. Press photographer, Asher Muir, notified.

## 2019-12-29 NOTE — ED Notes (Signed)
Pt on monitor 

## 2019-12-29 NOTE — ED Notes (Signed)
PT given Bedside Commode and educated on obtaining a UA.  Attempting to be obtained at this time.

## 2019-12-29 NOTE — ED Notes (Addendum)
Pt endorses that right side feels less than left side. Pt CBG 96. Pt able to stand, ambulatory into ED. Pt also report photosensitivity. Pt able to transfer to bed with no assistance. Pt also reports CP with deep inspiration

## 2019-12-29 NOTE — ED Triage Notes (Signed)
Pt arrives pov with family with c/o HA, shortness of breath and dizziness for over 1 week. Pt denies fever, pt endorses not being vaccinated for Covid. Pt also endorses decreased appetite.

## 2019-12-29 NOTE — ED Provider Notes (Signed)
Patient transferred from [facility] for MRI of head and C-spine.   Briefly: Patient is 24 y.o. she presented to [facility] for headache and shortness of breath. She states that she has had some headache, fatigue, body aches, chills, nausea and vomiting and decreased appetite for approximately 12 days now. Has some light sensitivity with her headache. She states that it is generalized circumferential headache. She states that she has felt tight in her chest she denies any loss of taste and smell. She is not vaccinated against COVID-19 and states that her daughter has had some similar symptoms primarily nausea and vomiting over the same time period.   MD at [facility] with plan to MRI head and C-spine. Patient in ER now is declining. After long shared decision-making irritation she would like to leave AMA. I discussed this with my doing physician Dr. Deretha Emory  She is completely asymptomatic at this time.   Physical Exam  BP 115/79   Pulse 65   Temp 97.7 F (36.5 C) (Oral)   Resp 11   Ht 5\' 1"  (1.549 m)   Wt 69.9 kg   LMP 12/26/2019 (Exact Date)   SpO2 100%   BMI 29.10 kg/m   Physical Exam Vitals and nursing note reviewed.  Constitutional:      General: She is not in acute distress. HENT:     Head: Normocephalic and atraumatic.     Nose: Nose normal.  Eyes:     General: No scleral icterus. Cardiovascular:     Rate and Rhythm: Normal rate and regular rhythm.     Pulses: Normal pulses.     Heart sounds: Normal heart sounds.  Pulmonary:     Effort: Pulmonary effort is normal. No respiratory distress.     Breath sounds: No wheezing.  Abdominal:     Palpations: Abdomen is soft.     Tenderness: There is no abdominal tenderness.  Musculoskeletal:     Cervical back: Normal range of motion.     Right lower leg: No edema.     Left lower leg: No edema.  Skin:    General: Skin is warm and dry.     Capillary Refill: Capillary refill takes less than 2 seconds.   Neurological:     Mental Status: She is alert. Mental status is at baseline.     Comments: Alert and oriented to self, place, time and event.   Speech is fluent, clear without dysarthria or dysphasia.   Strength 5/5 in upper/lower extremities  Sensation intact in upper/lower extremities   Normal gait.  Negative Romberg. No pronator drift.  Normal finger-to-nose and feet tapping.  CN I not tested  CN II grossly intact visual fields bilaterally. Did not visualize posterior eye.   CN III, IV, VI PERRLA and EOMs intact bilaterally  CN V Intact sensation to sharp and light touch to the face  CN VII facial movements symmetric  CN VIII not tested  CN IX, X no uvula deviation, symmetric rise of soft palate  CN XI 5/5 SCM and trapezius strength bilaterally  CN XII Midline tongue protrusion, symmetric L/R movements    Psychiatric:        Mood and Affect: Mood normal.        Behavior: Behavior normal.     ED Course/Procedures     Procedures Results for orders placed or performed during the hospital encounter of 12/29/19  Respiratory Panel by RT PCR (Flu A&B, Covid) - Nasopharyngeal Swab  Specimen: Nasopharyngeal Swab  Result Value Ref Range   SARS Coronavirus 2 by RT PCR POSITIVE (A) NEGATIVE   Influenza A by PCR NEGATIVE NEGATIVE   Influenza B by PCR NEGATIVE NEGATIVE  CBC with Differential  Result Value Ref Range   WBC 3.7 (L) 4.0 - 10.5 K/uL   RBC 4.81 3.87 - 5.11 MIL/uL   Hemoglobin 14.6 12.0 - 15.0 g/dL   HCT 95.1 36 - 46 %   MCV 88.1 80.0 - 100.0 fL   MCH 30.4 26.0 - 34.0 pg   MCHC 34.4 30.0 - 36.0 g/dL   RDW 88.4 16.6 - 06.3 %   Platelets 272 150 - 400 K/uL   nRBC 0.0 0.0 - 0.2 %   Neutrophils Relative % 12 %   Neutro Abs 0.5 (L) 1.7 - 7.7 K/uL   Lymphocytes Relative 78 %   Lymphs Abs 2.9 0.7 - 4.0 K/uL   Monocytes Relative 8 %   Monocytes Absolute 0.3 0.1 - 1.0 K/uL   Eosinophils Relative 2 %   Eosinophils Absolute 0.1 0.0 - 0.5 K/uL   Basophils Relative 0  %   Basophils Absolute 0.0 0.0 - 0.1 K/uL   WBC Morphology SMUDGE CELLS    RBC Morphology MORPHOLOGY UNREMARKABLE    Smear Review Normal platelet morphology    Immature Granulocytes 0 %   Abs Immature Granulocytes 0.01 0.00 - 0.07 K/uL   Reactive, Benign Lymphocytes PRESENT   Pregnancy, urine  Result Value Ref Range   Preg Test, Ur NEGATIVE NEGATIVE  Urinalysis, Routine w reflex microscopic Urine, Clean Catch  Result Value Ref Range   Color, Urine YELLOW YELLOW   APPearance CLEAR CLEAR   Specific Gravity, Urine 1.025 1.005 - 1.030   pH 6.0 5.0 - 8.0   Glucose, UA NEGATIVE NEGATIVE mg/dL   Hgb urine dipstick NEGATIVE NEGATIVE   Bilirubin Urine NEGATIVE NEGATIVE   Ketones, ur 15 (A) NEGATIVE mg/dL   Protein, ur NEGATIVE NEGATIVE mg/dL   Nitrite NEGATIVE NEGATIVE   Leukocytes,Ua NEGATIVE NEGATIVE  Comprehensive metabolic panel  Result Value Ref Range   Sodium 138 135 - 145 mmol/L   Potassium 3.7 3.5 - 5.1 mmol/L   Chloride 107 98 - 111 mmol/L   CO2 22 22 - 32 mmol/L   Glucose, Bld 88 70 - 99 mg/dL   BUN 5 (L) 6 - 20 mg/dL   Creatinine, Ser 0.16 0.44 - 1.00 mg/dL   Calcium 8.6 (L) 8.9 - 10.3 mg/dL   Total Protein 7.1 6.5 - 8.1 g/dL   Albumin 4.0 3.5 - 5.0 g/dL   AST 20 15 - 41 U/L   ALT 18 0 - 44 U/L   Alkaline Phosphatase 47 38 - 126 U/L   Total Bilirubin 0.4 0.3 - 1.2 mg/dL   GFR, Estimated >01 >09 mL/min   Anion gap 9 5 - 15  D-dimer, quantitative (not at [facility])  Result Value Ref Range   D-Dimer, Quant 0.29 0.00 - 0.50 ug/mL-FEU  CBG monitoring, ED  Result Value Ref Range   Glucose-Capillary 96 70 - 99 mg/dL  Troponin I (High Sensitivity)  Result Value Ref Range   Troponin I (High Sensitivity) <2 <18 ng/L   CT Head Wo Contrast  Result Date: 12/29/2019 CLINICAL DATA:  Numbness x 1 week two right upper extremity/right lower extremity. EXAM: CT HEAD WITHOUT CONTRAST TECHNIQUE: Contiguous axial images were obtained from the base of the skull through the vertex without  intravenous contrast. COMPARISON:  Head CT 12/08/2018.  FINDINGS: Brain: Cerebral volume is normal. There is no acute intracranial hemorrhage. No demarcated cortical infarct. No extra-axial fluid collection. No evidence of intracranial mass. No midline shift. Vascular: No hyperdense vessel. Skull: Normal. Negative for fracture or focal lesion. Sinuses/Orbits: Visualized orbits show no acute finding. Moderate ethmoid sinus mucosal thickening. Scattered fluid within the bilateral ethmoid air cells. Air-fluid levels within the partially imaged maxillary sinuses. No significant mastoid effusion. IMPRESSION: Unremarkable non-contrast CT appearance of the brain. No evidence of acute intracranial abnormality. Bilateral ethmoid and maxillary sinusitis. Electronically Signed   By: Jackey Loge DO   On: 12/29/2019 12:53   DG Chest Port 1 View  Result Date: 12/29/2019 CLINICAL DATA:  Right-sided chest pain, short of breath. EXAM: PORTABLE CHEST 1 VIEW COMPARISON:  12/07/2018 FINDINGS: The heart size and mediastinal contours are within normal limits. Both lungs are clear. The visualized skeletal structures are unremarkable. IMPRESSION: No active disease. Electronically Signed   By: Marlan Palau M.D.   On: 12/29/2019 12:55    MDM  Patient wishes to leave AGAINST MEDICAL ADVICE. I personally explained need for further testing and my concerns for adverse outcomes if workup is incomplete. Specific concerns explained to patient include worsening symptoms, functional loss, long term sickness and death. Patient states understanding of risks and states they will return if they feel the need to at a later date to receive the recommended care or any other care at any time, regardless of their ability to pay for such care. Patient understands they are able to return at any time. Patient is able to explain back the risks of leaving AMA and still wishes to leave.   Specifically I recommend MRI head/c spine to evaluate and  exclude stroke.  The patient is oriented to person, place, and time, has the capacity to make decisions regarding the medical care offered. The patient speaks coherently and exhibits no evidence of having an altered level of consciousness or alcohol or drug intoxication to a point that would impair judgment. They respond knowingly to questions about recommended treatment and alternate treatments including no further testing or treatment; participate in diagnostic and treatment decisions by means of rational thought processes; and understand the items of minimum basic medical treatment information with respect to that treatment (the nature and seriousness of the illness, the nature of the treatment, the probable degree and duration of any benefits and risks of any medical intervention that is being recommended, and the consequences of lack of treatment, and the nature, risks, and benefits of any reasonable alternatives).  The patient understands the relevant information of the nature of their medical condition, as well as the risks, benefits, and treatment alternatives (including non-treatment), consequences of refusing care, and can competently communicate a rational explanation about their choice of care options.    Included in AVS was the following message:  You have chosen to leave AGAINST MEDICAL ADVICE. Should you change your mind, you are always welcome and encouraged to return to the ED. You are encouraged to follow-up with, at the very least, a primary care provider, or other similar medical professional on this matter.    Notably patient does have COVID-19. Negative dimer low suspicion for PE. Overall reassuring work-up. As patient is refusing MRI will discharge AMA.    Solon Augusta [place], [place] 12/29/19 Berna Spare    Vanetta Mulders, MD 12/31/19 [phone]

## 2019-12-29 NOTE — ED Notes (Signed)
Pt at this time is stating she would like to leave prior to having MRI completed. EDP made aware.

## 2019-12-29 NOTE — ED Provider Notes (Signed)
[facility] EMERGENCY DEPARTMENT Provider Note   CSN: [id number] Arrival date & time: 12/29/19  1018     History Chief Complaint  Patient presents with  . Shortness of Breath  . Headache    Madison Stone is a 24 y.o. female.  The history is provided by the patient and medical records.  Shortness of Breath Associated symptoms: headaches   Headache  Madison Stone is a 24 y.o. female who presents to the Emergency Department complaining of HA and sob.  She got sick a week ago Sunday with vomiting. Had fever Monday, Tuesday and Wednesday.  She comes in today due to persistent N/V, unable to eat.  Feels sob.  Has light sensitivity. As generalized headache. Has right sided chest pain for the last week.  No loss of taste/smell.  Right sided (arm and leg) pins and needles for one week. She has not been vaccinated for COVID-19.  Her daughter got sick 10/1 with nausea and vomiting.    Past Medical History:  Diagnosis Date  . Heart murmur   . History of broken collarbone    age 3  . Infection    UTI  . Kidney stones   . Ovarian cyst   . PCOS (polycystic ovarian syndrome)   . Preterm labor     Patient Active Problem List   Diagnosis Date Noted  . [redacted] weeks gestation of pregnancy 12/08/2018  . Labor and delivery, indication for care 12/07/2018  . Normal labor 12/07/2018  . Substance abuse affecting pregnancy in third trimester, antepartum 11/15/2018  . History of preterm delivery 10/07/2018  . Supervision of other normal pregnancy, antepartum 07/16/2018  . Low birth weight or preterm infant, 1750-1999 grams 07/16/2018  . Migraine with aura and without status migrainosus, not intractable 07/04/2018  . Pregnancy headache in second trimester 07/04/2018    Past Surgical History:  Procedure Laterality Date  . CHOLECYSTECTOMY    . OVARIAN CYST REMOVAL     3 separate surgeries      OB History    Gravida  5   Para  4   Term  2   Preterm  2   AB  1    Living  4     SAB  1   TAB  0   Ectopic  0   Multiple  0   Live Births  4           Family History  Problem Relation Age of Onset  . COPD Maternal Grandmother   . Cancer Paternal Grandmother   . Cancer Paternal Grandfather        lung    Social History   Tobacco Use  . Smoking status: Former Smoker    Types: Cigarettes  . Smokeless tobacco: Never Used  . Tobacco comment: quit with first preg  Vaping Use  . Vaping Use: Never used  Substance Use Topics  . Alcohol use: Not Currently  . Drug use: Not Currently    Types: Marijuana    Comment: last in July    Home Medications Prior to Admission medications   Medication Sig Start Date End Date Taking? Authorizing Provider  cyclobenzaprine (FLEXERIL) 5 MG tablet Take 1 tablet (5 mg total) by mouth 3 (three) times daily as needed for muscle spasms. 12/09/18   Hermina Staggers, MD  ibuprofen (ADVIL) 600 MG tablet Take 1 tablet (600 mg total) by mouth every 6 (six) hours. 12/09/18   Hermina Staggers, MD  oxyCODONE (OXY  IR/ROXICODONE) 5 MG immediate release tablet Take 1 tablet (5 mg total) by mouth every 4 (four) hours as needed (pain scale 4-7). 12/09/18   Hermina Staggers, MD  Prenatal Vit-Fe Fumarate-FA (PRENATAL VITAMINS PO) Take 1 tablet by mouth daily.    [provider]    Allergies    Latex and Amoxicillin  Review of Systems   Review of Systems  Respiratory: Positive for shortness of breath.   Neurological: Positive for headaches.  All other systems reviewed and are negative.   Physical Exam Updated Vital Signs BP 115/74   Pulse 66   Temp 97.7 F (36.5 C) (Oral)   Resp 11   Ht 5\' 1"  (1.549 m)   Wt 69.9 kg   LMP 12/26/2019 (Exact Date)   SpO2 100%   BMI 29.10 kg/m   Physical Exam Vitals and nursing note reviewed.  Constitutional:      Appearance: She is well-developed.  HENT:     Head: Normocephalic and atraumatic.  Cardiovascular:     Rate and Rhythm: Normal rate and regular rhythm.      Heart sounds: No murmur heard.   Pulmonary:     Effort: Pulmonary effort is normal. No respiratory distress.     Breath sounds: Normal breath sounds.  Abdominal:     Palpations: Abdomen is soft.     Tenderness: There is no guarding or rebound.     Comments: Mild generalized abdominal tenderness  Musculoskeletal:        General: No tenderness.  Skin:    General: Skin is warm and dry.  Neurological:     Mental Status: She is alert and oriented to person, place, and time.     Comments: 5/5 strength in all four extremities with sensation to light touch intact in all four extremities.    Psychiatric:        Behavior: Behavior normal.     ED Results / Procedures / Treatments   Labs (all labs ordered are listed, but only abnormal results are displayed) Labs Reviewed  RESPIRATORY PANEL BY RT PCR (FLU A&B, COVID) - Abnormal; Notable for the following components:      Result Value   SARS Coronavirus 2 by RT PCR POSITIVE (*)    All other components within normal limits  CBC WITH DIFFERENTIAL/PLATELET - Abnormal; Notable for the following components:   WBC 3.7 (*)    Neutro Abs 0.5 (*)    All other components within normal limits  URINALYSIS, ROUTINE W REFLEX MICROSCOPIC - Abnormal; Notable for the following components:   Ketones, ur 15 (*)    All other components within normal limits  COMPREHENSIVE METABOLIC PANEL - Abnormal; Notable for the following components:   BUN 5 (*)    Calcium 8.6 (*)    All other components within normal limits  PREGNANCY, URINE  D-DIMER, QUANTITATIVE (NOT AT [facility])  PATHOLOGIST SMEAR REVIEW  CBG MONITORING, ED  TROPONIN I (HIGH SENSITIVITY)    EKG EKG Interpretation  Date/Time:  Tuesday December 29 2019 10:55:26 EDT Ventricular Rate:  59 PR Interval:    QRS Duration: 102 QT Interval:  426 QTC Calculation: 422 R Axis:   26 Text Interpretation: Sinus rhythm Consider left atrial enlargement Borderline repolarization abnormality repolarization  abnormality present on prior EKG 12/07/18 Confirmed by 12/09/18 [phone]) on 12/29/2019 11:08:21 AM   Radiology CT Head Wo Contrast  Result Date: 12/29/2019 CLINICAL DATA:  Numbness x 1 week two right upper extremity/right lower extremity. EXAM: CT HEAD  WITHOUT CONTRAST TECHNIQUE: Contiguous axial images were obtained from the base of the skull through the vertex without intravenous contrast. COMPARISON:  Head CT 12/08/2018. FINDINGS: Brain: Cerebral volume is normal. There is no acute intracranial hemorrhage. No demarcated cortical infarct. No extra-axial fluid collection. No evidence of intracranial mass. No midline shift. Vascular: No hyperdense vessel. Skull: Normal. Negative for fracture or focal lesion. Sinuses/Orbits: Visualized orbits show no acute finding. Moderate ethmoid sinus mucosal thickening. Scattered fluid within the bilateral ethmoid air cells. Air-fluid levels within the partially imaged maxillary sinuses. No significant mastoid effusion. IMPRESSION: Unremarkable non-contrast CT appearance of the brain. No evidence of acute intracranial abnormality. Bilateral ethmoid and maxillary sinusitis. Electronically Signed   By: Jackey Loge DO   On: 12/29/2019 12:53   DG Chest Port 1 View  Result Date: 12/29/2019 CLINICAL DATA:  Right-sided chest pain, short of breath. EXAM: PORTABLE CHEST 1 VIEW COMPARISON:  12/07/2018 FINDINGS: The heart size and mediastinal contours are within normal limits. Both lungs are clear. The visualized skeletal structures are unremarkable. IMPRESSION: No active disease. Electronically Signed   By: Marlan Palau M.D.   On: 12/29/2019 12:55    Procedures Procedures (including critical care time)  Medications Ordered in ED Medications  sodium chloride 0.9 % bolus 500 mL (0 mLs Intravenous Stopped 12/29/19 1439)  ondansetron (ZOFRAN) injection 4 mg (4 mg Intravenous Given 12/29/19 1301)  ketorolac (TORADOL) 15 MG/ML injection 15 mg (15 mg Intravenous  Given 12/29/19 1439)    ED Course  I have reviewed the triage vital signs and the nursing notes.  Pertinent labs & imaging results that were available during my care of the patient were reviewed by me and considered in my medical decision making (see chart for details).    MDM Rules/Calculators/A&P                          patient here for evaluation of fevers, headaches, chest pain, right arm and leg numbness. She is non-toxic appearing on evaluation with no focal neurologic deficits. CT head is negative for acute abnormality. D dimer is negative, patient is low risk for PE. Presentation is not consistent with PE, ACS, dissection. COVID-19 test is positive, likely contributing to many of her symptoms. Given her paresthesias and COVID-19 infection recommend MRI to further evaluate her symptoms. MRI is not available at this facility, discussed with Dr. Adela Lank at [facility] emergency department. Plan to transfer by via private vehicle for further evaluation of her numbness.  Discussed with on-call neurologist, who recommends MRI brain and cervical spine without contrast.  Luciel Brickman was evaluated in Emergency Department on 12/29/2019 for the symptoms described in the history of present illness. She was evaluated in the context of the global COVID-19 pandemic, which necessitated consideration that the patient might be at risk for infection with the SARS-CoV-2 virus that causes COVID-19. Institutional protocols and algorithms that pertain to the evaluation of patients at risk for COVID-19 are in a state of rapid change based on information released by regulatory bodies including the CDC and federal and state organizations. These policies and algorithms were followed during the patient's care in the ED.   Final Clinical Impression(s) / ED Diagnoses Final diagnoses:  COVID-19 virus infection  Paresthesia    Rx / DC Orders ED Discharge Orders    None       Tilden Fossa, MD 12/29/19  1517

## 2019-12-31 LAB — PATHOLOGIST SMEAR REVIEW

## 2020-04-22 ENCOUNTER — Encounter: Payer: Medicaid Other | Admitting: Obstetrics & Gynecology

## 2021-04-23 IMAGING — US US MFM OB COMP + 14 WK
1 series · 13 of 28 positions shown · non-contrast
Comparison: none

[Series 1: us mfm ob comp + 14 wk · 13 of 97 slices shown]
[im 4/97]
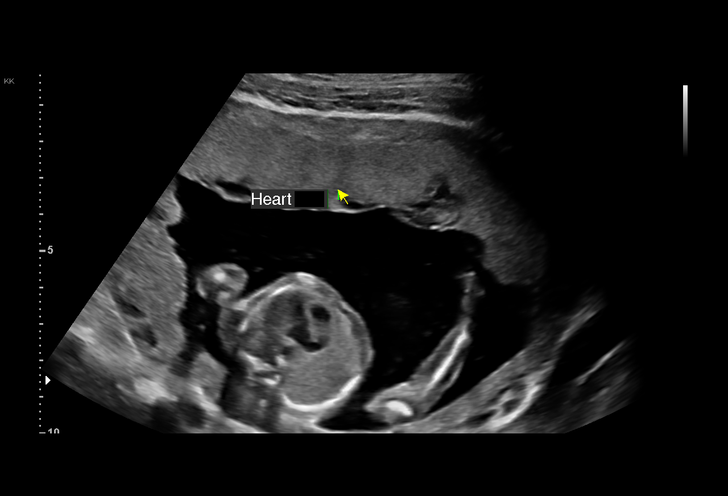
[im 11/97]
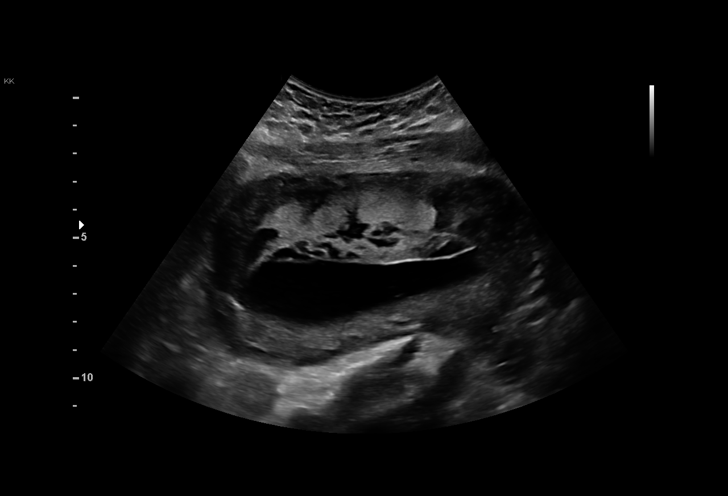
[im 18/97]
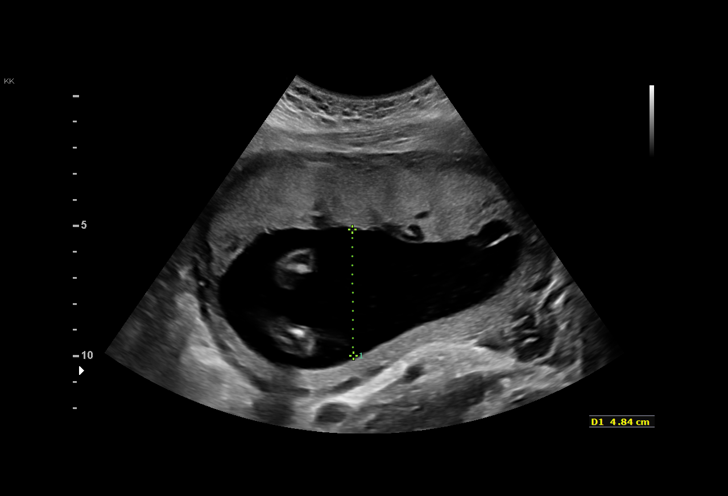
[im 25/97]
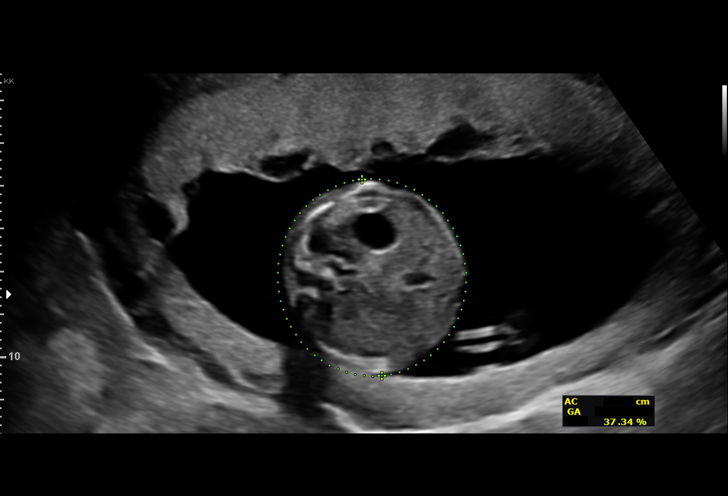
[im 33/97]
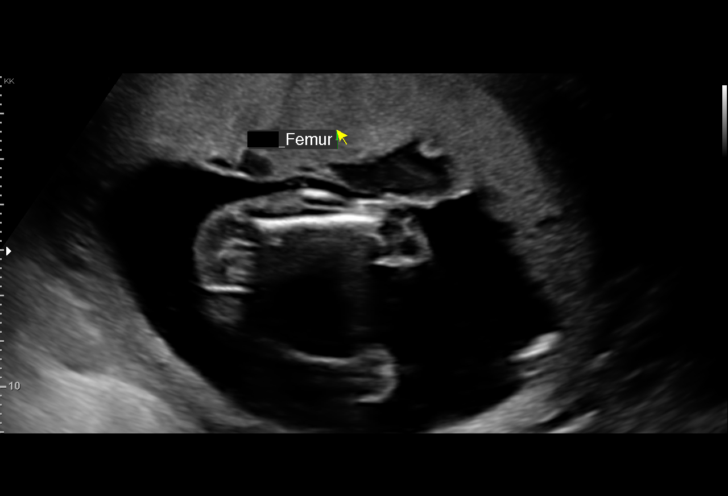
[im 40/97]
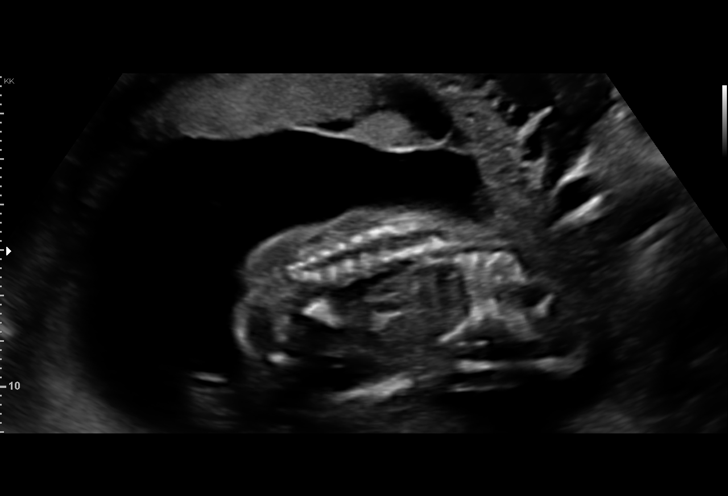
[im 50/97]
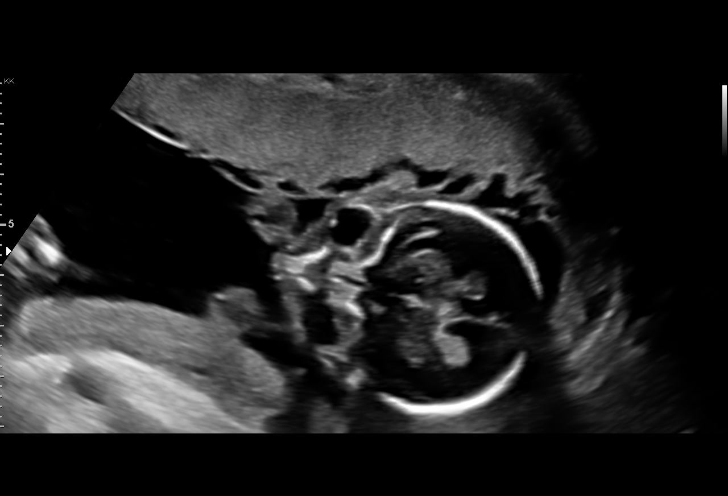
[im 57/97]
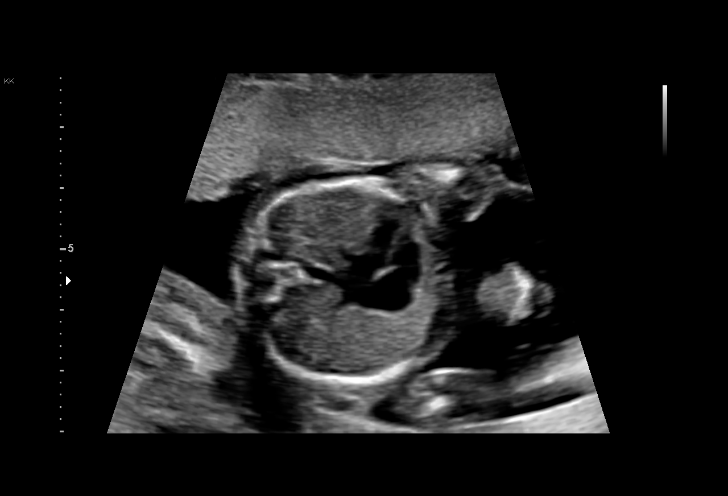
[im 65/97]
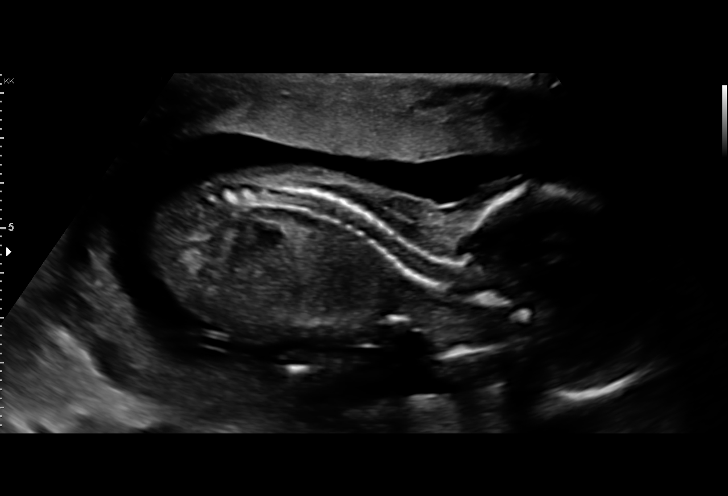
[im 72/97]
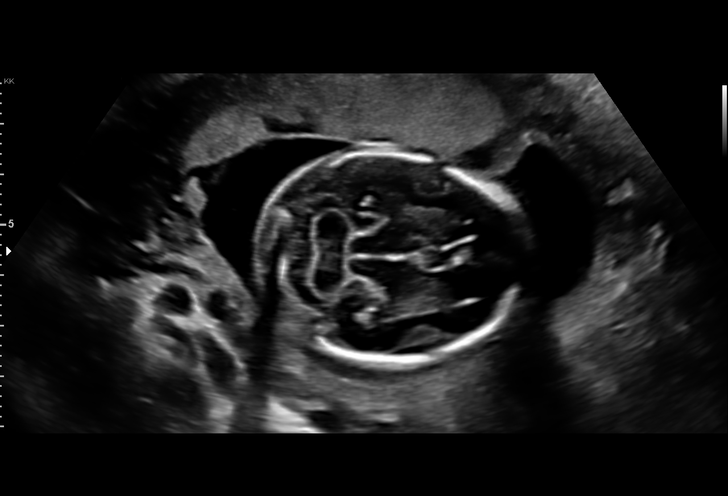
[im 79/97]
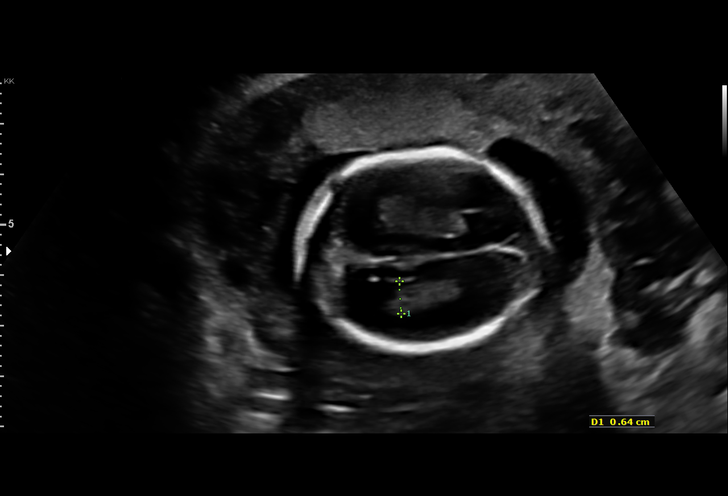
[im 86/97]
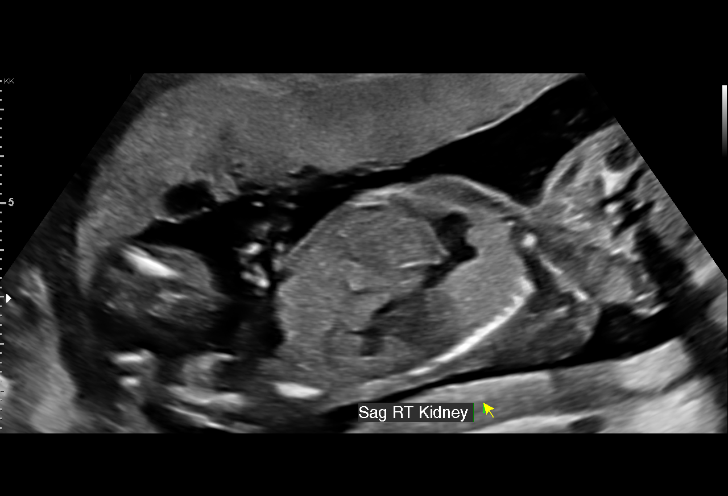
[im 93/97]
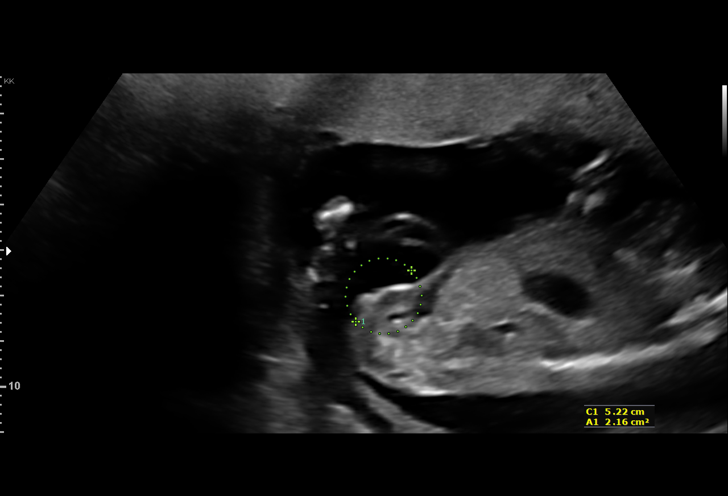

[13 of 28 positions shown; findings below may reference images not displayed]

NASHR

                                                       MALICSE
 ----------------------------------------------------------------------

 ----------------------------------------------------------------------
Indications

  18 weeks gestation of pregnancy
  Encounter for antenatal screening for
  malformations
 ----------------------------------------------------------------------
Vital Signs

 BMI:
Fetal Evaluation

 Num Of Fetuses:          1
 Fetal Heart Rate(bpm):   157
 Cardiac Activity:        Observed
 Presentation:            Cephalic
 Placenta:                Anterior
 P. Cord Insertion:       Visualized

 Amniotic Fluid
 AFI FV:      Within normal limits

                             Largest Pocket(cm)

Biometry

 BPD:      40.4  mm     G. Age:  18w 2d         49  %    CI:        69.11   %    70 - 86
                                                         FL/HC:       18.2  %    15.8 - 18
 HC:      155.2  mm     G. Age:  18w 3d         51  %    HC/AC:       1.12       1.07 -
 AC:      138.9  mm     G. Age:  19w 2d         78  %    FL/BPD:      70.0  %
 FL:       28.3  mm     G. Age:  18w 5d         59  %    FL/AC:       20.4  %    20 - 24
 NFT:       3.3  mm

 Est. FW:     265   gm     0 lb 9 oz     58  %
OB History

 Gravidity:    5         Term:   2        Prem:   1        SAB:   1
 TOP:          0       Ectopic:  0        Living: 3
Gestational Age

 LMP:           19w 5d        Date:  03/09/18                 EDD:   12/14/18
 U/S Today:     18w 5d                                        EDD:   12/21/18
 Best:          18w 2d     Det. By:  Early Ultrasound         EDD:   12/24/18
                                     (05/01/18)
Anatomy

 Cranium:               Appears normal         Aortic Arch:            Not well visualized
 Cavum:                 Appears normal         Ductal Arch:            Not well visualized
 Ventricles:            Appears normal         Diaphragm:              Appears normal
 Choroid Plexus:        Appears normal         Stomach:                Appears normal, left
                                                                       sided
 Cerebellum:            Appears normal         Abdomen:                Appears normal
 Posterior Fossa:       Appears normal         Abdominal Wall:         Appears nml (cord
                                                                       insert, abd wall)
 Nuchal Fold:           Appears normal         Cord Vessels:           Appears normal (3
                                                                       vessel cord)
 Face:                  Orbits nl; profile not Kidneys:                Appear normal
                        well visualized
 Lips:                  Appears normal         Bladder:                Appears normal
 Thoracic:              Appears normal         Spine:                  Appears normal
 Heart:                 Appears normal         Upper Extremities:      Not well visualized
                        (4CH, axis, and
                        situs)
 RVOT:                  Not well visualized    Lower Extremities:      Appears normal
 LVOT:                  Appears normal

 Other:  Female gender Technically difficult due to fetal position.
Cervix Uterus Adnexa

 Cervix
 Length:            5.1  cm.
 Normal appearance by transabdominal scan.
Impression

 Normal interval growth.  No ultrasonic evidence of structural
 fetal anomalies.
 Suboptimal views of the fetal anatomy was obtained
 secondary to fetal position.
 Dating by early US.
Recommendations

 Follow up anatomy in 4 weeks.
